# Patient Record
Sex: Female | Born: 1975 | Race: White | Hispanic: No | Smoking: Never smoker
Health system: Southern US, Community
[De-identification: ages and names within clinical notes are randomized; demographics above are authoritative.]

## PROBLEM LIST (undated history)

## (undated) DIAGNOSIS — F329 Major depressive disorder, single episode, unspecified: Secondary | ICD-10-CM

## (undated) DIAGNOSIS — F419 Anxiety disorder, unspecified: Secondary | ICD-10-CM

## (undated) DIAGNOSIS — E039 Hypothyroidism, unspecified: Secondary | ICD-10-CM

## (undated) DIAGNOSIS — F32A Depression, unspecified: Secondary | ICD-10-CM

## (undated) DIAGNOSIS — I10 Essential (primary) hypertension: Secondary | ICD-10-CM

## (undated) DIAGNOSIS — E079 Disorder of thyroid, unspecified: Secondary | ICD-10-CM

## (undated) HISTORY — DX: Essential (primary) hypertension: I10

## (undated) HISTORY — DX: Anxiety disorder, unspecified: F41.9

## (undated) HISTORY — DX: Hypothyroidism, unspecified: E03.9

## (undated) HISTORY — DX: Depression, unspecified: F32.A

## (undated) HISTORY — PX: HX WISDOM TEETH EXTRACTION: SHX21

## (undated) HISTORY — PX: CHOLECYSTECTOMY: SHX55

## (undated) HISTORY — DX: Disorder of thyroid, unspecified: E07.9

---
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
## 2017-08-25 ENCOUNTER — Encounter (HOSPITAL_BASED_OUTPATIENT_CLINIC_OR_DEPARTMENT_OTHER): Payer: Self-pay | Admitting: Obstetrics & Gynecology

## 2017-09-20 ENCOUNTER — Encounter (HOSPITAL_BASED_OUTPATIENT_CLINIC_OR_DEPARTMENT_OTHER): Payer: Self-pay | Admitting: Obstetrics & Gynecology

## 2017-09-20 ENCOUNTER — Ambulatory Visit: Payer: No Typology Code available for payment source | Attending: Obstetrics & Gynecology

## 2017-09-20 ENCOUNTER — Ambulatory Visit (INDEPENDENT_AMBULATORY_CARE_PROVIDER_SITE_OTHER): Payer: No Typology Code available for payment source | Admitting: Obstetrics & Gynecology

## 2017-09-20 VITALS — BP 124/72 | Wt 341.0 lb

## 2017-09-20 DIAGNOSIS — R5383 Other fatigue: Secondary | ICD-10-CM | POA: Insufficient documentation

## 2017-09-20 DIAGNOSIS — Z833 Family history of diabetes mellitus: Secondary | ICD-10-CM | POA: Insufficient documentation

## 2017-09-20 DIAGNOSIS — N911 Secondary amenorrhea: Secondary | ICD-10-CM

## 2017-09-20 DIAGNOSIS — N926 Irregular menstruation, unspecified: Principal | ICD-10-CM | POA: Insufficient documentation

## 2017-09-20 LAB — GLUCOSE FASTING: GLUCOSE FASTING: 96 mg/dL (ref 68–99)

## 2017-09-20 LAB — INSULIN, SERUM: INSULIN: 13.9 m[IU]/mL (ref 2.3–26.0)

## 2017-09-20 LAB — CBC
HCT: 37.2 % (ref 37.0–47.0)
HGB: 12.7 g/dL (ref 12.0–16.0)
MCH: 27.4 pg (ref 27.0–31.0)
MCHC: 34.2 g/dL (ref 33.0–37.0)
MCV: 80.2 fL (ref 80.0–99.0)
PLATELETS: 291 x10ˆ3/uL (ref 130–400)
RBC: 4.64 10*6/uL (ref 4.20–5.40)
RBC: 4.64 x10?6/uL (ref 4.20–5.40)
RDW: 15.5 % — ABNORMAL HIGH (ref 11.5–14.5)
WBC: 6.7 x10ˆ3/uL (ref 4.8–10.8)

## 2017-09-20 LAB — THYROID STIMULATING HORMONE (SENSITIVE TSH): TSH: 4.81 u[IU]/mL — ABNORMAL HIGH (ref 0.465–4.680)

## 2017-09-20 LAB — HCG, PLASMA OR SERUM QUANTITATIVE, PREGNANCY: HCG, QUANTITATIVE: <2.39 m[IU]/mL (ref 0.00–4.83)

## 2017-09-20 MED ORDER — MEDROXYPROGESTERONE 10 MG TABLET
10.0000 mg | ORAL_TABLET | Freq: Every day | ORAL | 0 refills | Status: AC
Start: 2017-09-20 — End: 2017-09-30

## 2017-09-20 NOTE — Progress Notes (Signed)
OB-GYN Progress Note      History of Present Illness    Patient is a 42 year old female here today for evaluation of irregular menses.  LMP was 06-2017.  There was 1 day of vaginal bleeding in 08-2017.  Patient has had negative home pregnancy test.  Not using contraception.  No galactorrhea.  No nausea vomiting or fever.  Positive fatigue.  Positive family history of diabetes.  Contraception.      History    Past Medical History:   Diagnosis Date   . Anxiety    . Depression    . HTN (hypertension)          Past Surgical History:   Procedure Laterality Date   . [place] 2012     No Known Allergies    Outpatient Medications Prior to Visit:  escitalopram oxalate (LEXAPRO) 20 mg Oral Tablet Take 20 mg by mouth Once a day   lisinopril (PRINIVIL) 10 mg Oral Tablet Take 10 mg by mouth Once a day     No facility-administered medications prior to visit.   Social History     Socioeconomic History   . Marital status: Single     Spouse name: Not on file   . Number of children: Not on file   . Years of education: Not on file   . Highest education level: Not on file   Social Needs   . Financial resource strain: Not on file   . Food insecurity - worry: Not on file   . Food insecurity - inability: Not on file   . Transportation needs - medical: Not on file   . Transportation needs - non-medical: Not on file   Occupational History   . Not on file   Tobacco Use   . Smoking status: Never Smoker   . Smokeless tobacco: Never Used   Substance and Sexual Activity   . Alcohol use: Not on file   . Drug use: Not on file   . Sexual activity: Not on file   Other Topics Concern   . Not on file   Social History Narrative   . Not on file     Family Medical History:     Problem Relation (Age of Onset)    Clotting Disorder Maternal Grandmother    Diabetes Paternal Grandmother, Maternal Grandmother    Hypertension Maternal Grandmother, Mother    Uterine Fibroids Mother            OB History     Gravida   2    Para   2    Term     2    Preterm        AB        Living   2       SAB        TAB        Ectopic        Multiple        Live Births   2                 ROS  General: Negative for appetite loss, chills, fever and sweats  Skin: Negative for hives, pruritus and rash  HEENT: Negative for headache and blurred vision  Neck: Negative for neck pain or stiffness  Respiratory: Negative for shortness of breath and cough  Breast: Negative for breast mass and nipple discharge  Cardiovascular: Negative for chest pain or  palpitations  Gastrointestinal: Negative for abdominal pain or consitpation  Genitourinary: Negative for urin ary complaints, vaginal discharge, and UTI     Physical Exam  BP 124/72   Wt (!) 154.7 kg (341 lb)         General  Mental Status: dresses appropriately and alert  General Appearance: healthy, no distress  Orientation: Person, Place and Time    Integumentary  General Characteristics:  Normal color, texture and turgor without significant lesions or rashes    Abdomen  Palpation:  soft, nontender, no palpable masses, no guarding or rebound tenderness    Female Genitourinary  Genitalia: Normal external genitalia, no erythema, no discharge, no vaginal discharge, normal vagina, normal cervix, normal uterus, size and consistency, normal adnexa without tenderness.  No palpable pelvic mass.      Assessment  1. Menstrual irregularity-patient had regular menses until after LMP 06-2017.  Recommended workup as outlined below.  Discussed potential peri menopausal change at age 45 with oligo ovulation.  Also discussed possible component of PCOS with chronic anovulation.  Present weight is 341 lb.  Discussed role of diet exercise and weight reduction with regard to enhancing ovulatory potential.  2. Fatigue  3. Family history of diabetes-grandmother x2.  Patient also states father was "prediabetic.  4. Contraception-none.  Patient declines.    Plan  1. Serum HCG today  2. Fasting insulin  3. Fasting glucose  4. CBC  5. TSH  6.  Prolactin  7. Provera 10 mg 1 p.o. daily times 10 days-anticipate withdrawal bleed thereafter  8. Follow-up in 3-4 weeks for well-woman examination and re-evaluation.  Discussed periodic progestin therapy verses combination OCPs        Mary Mire, MD

## 2017-09-21 LAB — THYROXINE, FREE (FREE T4): THYROXINE (T4), FREE: 1.08 ng/dL (ref 0.78–2.19)

## 2017-09-21 LAB — T3 (TRIIODOTHYRONINE), FREE, SERUM: T3 FREE: 4.2 pg/mL (ref 2.8–5.2)

## 2017-09-22 ENCOUNTER — Telehealth (HOSPITAL_BASED_OUTPATIENT_CLINIC_OR_DEPARTMENT_OTHER): Payer: Self-pay | Admitting: Obstetrics & Gynecology

## 2017-09-22 DIAGNOSIS — E039 Hypothyroidism, unspecified: Secondary | ICD-10-CM

## 2017-09-22 MED ORDER — LEVOTHYROXINE 25 MCG TABLET
12.50 ug | ORAL_TABLET | Freq: Every morning | ORAL | 2 refills | Status: AC
Start: 2017-09-22 — End: ?

## 2017-09-22 NOTE — Telephone Encounter (Signed)
-----   Message from Rogelia Mire, MD sent at 09/21/2017  8:22 PM EDT -----  Please notify patient that TSH is slightly elevated at 4.8.  Free T3 and free T4 are normal.  Given that patient has irregular menses with fatigue an elevated TSH, all could be consistent with subclinical hypothyroidism.  Rx Synthroid 25 mg, 1/2 tab daily.  Dispense 30 tabs, refill x2.  Repeat TSH in 6 weeks.

## 2017-09-22 NOTE — Telephone Encounter (Signed)
Pt notified of TSH, Free T3, and FreeT4 results and Dr. Javier Docker recommendation for Rx synthroid 51mcg 1/2 tab po and to repeat TSH in 6 weeks. Pt verbalized understanding. ERx to be sent to pt's pharmacy.    Hoyle Sauer, RN

## 2017-10-19 ENCOUNTER — Other Ambulatory Visit: Payer: No Typology Code available for payment source | Attending: Obstetrics & Gynecology

## 2017-10-19 ENCOUNTER — Encounter (HOSPITAL_BASED_OUTPATIENT_CLINIC_OR_DEPARTMENT_OTHER): Payer: Self-pay | Admitting: Obstetrics & Gynecology

## 2017-10-19 ENCOUNTER — Other Ambulatory Visit (HOSPITAL_BASED_OUTPATIENT_CLINIC_OR_DEPARTMENT_OTHER): Payer: Self-pay | Admitting: Obstetrics & Gynecology

## 2017-10-19 ENCOUNTER — Ambulatory Visit (INDEPENDENT_AMBULATORY_CARE_PROVIDER_SITE_OTHER): Payer: No Typology Code available for payment source | Admitting: Obstetrics & Gynecology

## 2017-10-19 VITALS — BP 128/84 | Wt 340.0 lb

## 2017-10-19 DIAGNOSIS — Z01419 Encounter for gynecological examination (general) (routine) without abnormal findings: Secondary | ICD-10-CM | POA: Insufficient documentation

## 2017-10-19 DIAGNOSIS — Z124 Encounter for screening for malignant neoplasm of cervix: Secondary | ICD-10-CM

## 2017-10-19 DIAGNOSIS — Z1231 Encounter for screening mammogram for malignant neoplasm of breast: Secondary | ICD-10-CM

## 2017-10-19 DIAGNOSIS — Z1239 Encounter for other screening for malignant neoplasm of breast: Secondary | ICD-10-CM

## 2017-10-19 NOTE — Progress Notes (Signed)
OB-GYN Progress Note      History of Present Illness  Patient is a 42 year old female here today for well-woman examination.  Patient was last seen 09/20/2017 for menstrual irregularity.  History regular menses in 2018 until after LMP 06-2017.  She had 1 day of vaginal bleeding 08-2017.  Labs 09/20/2017 revealed hemoglobin 12.7, prolactin 12.8, fasting glucose 96, fasting insulin 13.9, hCG negative, TSH 4.81 (high).  Free T3 and free T4 normal.  Patient was empirically started on a regimen the Synthroid 25 mcg daily orders given for follow-up TSH in 6 weeks.  The patient was given a 10 day course of Provera which led to a withdraw menstrual bleeding event on 10/02/2017.    We discussed contraception and the patient declines.  Patient indicates she would welcome pregnancy if it happened.  We did discussed advanced maternal age and pregnancy related risk should conception occur, as well as increased risk of fetal chromosomal anomalies and adverse outcomes.  Patient verbalized understanding.    History    Past Medical History:   Diagnosis Date   . Anxiety    . Depression    . HTN (hypertension)    . Hypothyroidism          Past Surgical History:   Procedure Laterality Date   . [place] 2012   . HX WISDOM TEETH EXTRACTION       No Known Allergies    Outpatient Medications Prior to Visit:  levothyroxine (SYNTHROID) 25 mcg Oral Tablet Take 0.5 Tabs (12.5 mcg total) by mouth Every morning     No facility-administered medications prior to visit. .med  Social History     Socioeconomic History   . Marital status: Single     Spouse name: Not on file   . Number of children: Not on file   . Years of education: Not on file   . Highest education level: Not on file   Occupational History   . Not on file   Social Needs   . Financial resource strain: Not on file   . Food insecurity:     Worry: Not on file     Inability: Not on file   . Transportation needs:     Medical: Not on file     Non-medical: Not on file     Tobacco Use   . Smoking status: Never Smoker   . Smokeless tobacco: Never Used   Substance and Sexual Activity   . Alcohol use: Not on file   . Drug use: Not on file   . Sexual activity: Not on file   Lifestyle   . Physical activity:     Days per week: Not on file     Minutes per session: Not on file   . Stress: Not on file   Relationships   . Social connections:     Talks on phone: Not on file     Gets together: Not on file     Attends religious service: Not on file     Active member of club or organization: Not on file     Attends meetings of clubs or organizations: Not on file     Relationship status: Not on file   . Intimate partner violence:     Fear of current or ex partner: Not on file     Emotionally abused: Not on file     Physically abused: Not on file  Forced sexual activity: Not on file   Other Topics Concern   . Not on file   Social History Narrative   . Not on file     Family Medical History:     Problem Relation (Age of Onset)    Clotting Disorder Maternal Grandmother    Diabetes Paternal Grandmother, Maternal Grandmother    Hypertension Maternal Grandmother, Mother    Uterine Fibroids Mother            OB History     Gravida   2    Para   2    Term   2    Preterm        AB        Living   2       SAB        TAB        Ectopic        Multiple        Live Births   2                 ROS  General: Negative for appetite loss, chills, fever and sweats  Skin: Negative for hives, pruritus and rash  HEENT: Negative for headache and blurred vision  Neck: Negative for neck pain or stiffness  Respiratory: Negative for shortness of breath and cough  Breast: Negative for breast mass and nipple discharge  Cardiovascular: Negative for chest pain or palpitations  Gastrointestinal: Negative for abdominal pain or consitpation  Genitourinary: Negative for urin ary complaints, vaginal discharge, and UTI     Physical Exam  BP 128/84   Wt (!) 154.2 kg (340 lb)   LMP 10/02/2017         General  Mental Status: dresses  appropriately and alert  General Appearance: healthy, no distress  Orientation: Person, Place and Time    Integumentary  General Characteristics:  Normal color, texture and turgor without significant lesions or rashes    Breast  Nipple       Right:  Normal        Left:     Normal   Breast       Right:  Soft and Masses: none       Left:   Soft and Masses: none    Axilla  Palpation: No palpable axillary lymph nodes, right or left     Abdomen  Palpation:  soft, nontender, no palpable masses, no guarding or rebound tenderness    Female Genitourinary  Genitalia: Normal external genitalia, no erythema, no discharge, no vaginal discharge, normal vagina, normal cervix, normal uterus, size and consistency, normal adnexa without tenderness.  No palpable pelvic mass.      Assessment  1. Well-woman examination-doing well  2. Contraception-none.  Discussed options available and patient declines.  Patient states she would welcome pregnancy if it occurred.  Counseled regarding risks and elevated rates of fetal chromosomal anomalies and adverse outcomes.  3. Subclinical hypothyroidism with mild elevation a TSH--free T3 and free T4 are normal.  Patient has been started on empiric regimen of Synthroid 20 mcg daily.    4. Menstrual irregularity--menses were regular prior to last normal spontaneous menses 06-2017..  Menses had ceased thereafter other that a single 1 day bleeding event 08-2017.  Workup completed and patient did have a positive withdrawal bleed following a 10 day Provera challenge.  Subclinical hypothyroidism diagnosed empiric therapy initiated.  Discussed option hormonal management which patient declined, indicating her preference would be to  continue with Synthroid supplement and observe for reestablishment spontaneous menstrual cycles.        Plan  1. Pap Done  2. Monthly self-breast checks recommended  3. Continue Synthroid 25 mcg daily  4. Repeat TSH in 4 more weeks  5. Menstrual calendar provided to the patient  6.  Follow-up 4 months recheck and p.r.n.   7. Mammogram ordered    Rogelia Mire, MD

## 2017-10-25 LAB — HISTORICAL CYTOPATHOLOGY-GYN (PAP AND HPV TESTS)

## 2017-12-01 ENCOUNTER — Ambulatory Visit (HOSPITAL_BASED_OUTPATIENT_CLINIC_OR_DEPARTMENT_OTHER): Payer: No Typology Code available for payment source

## 2017-12-02 ENCOUNTER — Ambulatory Visit
Admission: RE | Admit: 2017-12-02 | Discharge: 2017-12-02 | Disposition: A | Discharge status: Home / Self Care | Payer: No Typology Code available for payment source | Source: Ambulatory Visit | Attending: Obstetrics & Gynecology | Admitting: Obstetrics & Gynecology

## 2017-12-02 ENCOUNTER — Encounter (HOSPITAL_BASED_OUTPATIENT_CLINIC_OR_DEPARTMENT_OTHER): Payer: Self-pay

## 2017-12-02 DIAGNOSIS — Z1231 Encounter for screening mammogram for malignant neoplasm of breast: Secondary | ICD-10-CM | POA: Insufficient documentation

## 2017-12-02 DIAGNOSIS — Z1239 Encounter for other screening for malignant neoplasm of breast: Secondary | ICD-10-CM

## 2018-02-17 ENCOUNTER — Encounter (HOSPITAL_BASED_OUTPATIENT_CLINIC_OR_DEPARTMENT_OTHER): Payer: Self-pay | Admitting: Obstetrics & Gynecology

## 2018-02-17 ENCOUNTER — Ambulatory Visit (INDEPENDENT_AMBULATORY_CARE_PROVIDER_SITE_OTHER): Payer: Self-pay | Admitting: Obstetrics & Gynecology

## 2018-02-17 VITALS — BP 142/98 | Wt 349.0 lb

## 2018-02-17 DIAGNOSIS — E038 Other specified hypothyroidism: Secondary | ICD-10-CM

## 2018-02-17 DIAGNOSIS — E039 Hypothyroidism, unspecified: Secondary | ICD-10-CM

## 2018-02-17 DIAGNOSIS — N925 Other specified irregular menstruation: Secondary | ICD-10-CM

## 2018-02-17 DIAGNOSIS — N926 Irregular menstruation, unspecified: Secondary | ICD-10-CM

## 2018-02-17 NOTE — Progress Notes (Signed)
OB-GYN Progress Note      History of Present Illness    This patient is a 42 year old female who is here for re-evaluation regarding menstrual irregularity and hypothyroidism.  Patient her well-woman examination 10/19/2017.  Menstrual irregularity was evaluated and the patient was found to have subclinical hypothyroidism.  She was started on a regimen of Synthroid 25 micro g half tab daily.  Patient was to have a repeat TSH done thereafter, which did not happen.  Patient has been keeping a menstrual calendar.  The patient had 3 menstrual cycle since her last visit, which were spaced at 45 day and 34 day intervals.  Normal characteristic menstrual flow once bleeding started.    History    Past Medical History:   Diagnosis Date   . Anxiety    . Depression    . HTN (hypertension)    . Hypothyroidism          Past Surgical History:   Procedure Laterality Date   . [place] 2012   . HX WISDOM TEETH EXTRACTION       No Known Allergies    Outpatient Medications Prior to Visit:  levothyroxine (SYNTHROID) 25 mcg Oral Tablet Take 0.5 Tabs (12.5 mcg total) by mouth Every morning     No facility-administered medications prior to visit. .med  Social History     Socioeconomic History   . Marital status: Single     Spouse name: Not on file   . Number of children: Not on file   . Years of education: Not on file   . Highest education level: Not on file   Occupational History   . Not on file   Social Needs   . Financial resource strain: Not on file   . Food insecurity:     Worry: Not on file     Inability: Not on file   . Transportation needs:     Medical: Not on file     Non-medical: Not on file   Tobacco Use   . Smoking status: Never Smoker   . Smokeless tobacco: Never Used   Substance and Sexual Activity   . Alcohol use: Not on file   . Drug use: Not on file   . Sexual activity: Not on file   Lifestyle   . Physical activity:     Days per week: Not on file     Minutes per session: Not on file   . Stress: Not  on file   Relationships   . Social connections:     Talks on phone: Not on file     Gets together: Not on file     Attends religious service: Not on file     Active member of club or organization: Not on file     Attends meetings of clubs or organizations: Not on file     Relationship status: Not on file   . Intimate partner violence:     Fear of current or ex partner: Not on file     Emotionally abused: Not on file     Physically abused: Not on file     Forced sexual activity: Not on file   Other Topics Concern   . Not on file   Social History Narrative   . Not on file     Family Medical History:     Problem Relation (Age of Onset)    Clotting Disorder Maternal Grandmother  Diabetes Paternal Grandmother, Maternal Grandmother    Hypertension (High Blood Pressure) Maternal Grandmother, Mother    Uterine Fibroids Mother            OB History     Gravida   2    Para   2    Term   2    Preterm        AB        Living   2       SAB        TAB        Ectopic        Multiple        Live Births   2                 ROS  General: Negative for appetite loss, chills, fever and sweats  Skin: Negative for hives, pruritus and rash  HEENT: Negative for headache and blurred vision  Neck: Negative for neck pain or stiffness  Respiratory: Negative for shortness of breath and cough  Breast: Negative for breast mass and nipple discharge  Cardiovascular: Negative for chest pain or palpitations  Gastrointestinal: Negative for abdominal pain or consitpation  Genitourinary: Negative for urin ary complaints, vaginal discharge, and UTI     Physical Exam  BP (!) 142/98   Wt (!) 158.3 kg (349 lb)   LMP 01/09/2018         General  Mental Status: dresses appropriately and alert  General Appearance: healthy, no distress  Orientation: Person, Place and Time    Integumentary  General Characteristics:  Normal color, texture and turgor without significant lesions or rashes        Assessment  1. Subclinical hypothyroidism with elevation and TSH.   Free T3 and free T4 normal.  Taking Synthroid 25 ucg, half tab daily since last visit 10/19/2017.  Patient failed to follow-up for repeat TSH is ordered.  2. Menstrual irregularity for the last 9 months.  Patient documented 3 menstrual cycles on her menstrual calendar which were separated by 45 day interval and 34 day interval.  Characteristic menstrual flow once onset of bleeding begins.  Patient is satisfied with menstrual activity as is and declines hormone Rx for menstrual regulation  3. Contraception-none.  Discussed options available and patient declines.  Patient states she would welcome pregnancy if it occurred.  Counseled regarding risks and elevated rates of fetal chromosomal anomalies and adverse outcomes.      Plan  1. TSH today  2. Continue Synthroid 25 micro g 1/2 tab daily  3. Continue menstrual calendar  4. Follow-up 4 months recheck and p.r.n.       Rogelia Mire, MD

## 2018-06-21 ENCOUNTER — Ambulatory Visit (HOSPITAL_BASED_OUTPATIENT_CLINIC_OR_DEPARTMENT_OTHER): Payer: Self-pay | Admitting: Obstetrics & Gynecology

## 2018-10-25 ENCOUNTER — Other Ambulatory Visit (HOSPITAL_BASED_OUTPATIENT_CLINIC_OR_DEPARTMENT_OTHER): Payer: Self-pay | Admitting: Obstetrics & Gynecology

## 2019-01-15 ENCOUNTER — Other Ambulatory Visit: Payer: Self-pay

## 2019-01-15 ENCOUNTER — Ambulatory Visit: Payer: No Typology Code available for payment source | Attending: Obstetrics & Gynecology

## 2019-01-15 ENCOUNTER — Ambulatory Visit (HOSPITAL_BASED_OUTPATIENT_CLINIC_OR_DEPARTMENT_OTHER): Payer: Self-pay | Admitting: Obstetrics & Gynecology

## 2019-01-15 DIAGNOSIS — Z3490 Encounter for supervision of normal pregnancy, unspecified, unspecified trimester: Secondary | ICD-10-CM

## 2019-01-15 NOTE — Telephone Encounter (Signed)
Pt called office stating lmp 08/13/18; h/o irregular menses. Pt stated had faint, positive upt; requesting blood pregnancy test. PO Dr. Ander Purpura ordered quantitative bhcg. Explained Dr. Javier Docker recommendations to pt. Pt verbalized understanding. Order entered.    Hoyle Sauer, RN

## 2019-01-16 ENCOUNTER — Ambulatory Visit (HOSPITAL_BASED_OUTPATIENT_CLINIC_OR_DEPARTMENT_OTHER): Payer: Self-pay | Admitting: Obstetrics & Gynecology

## 2019-01-16 NOTE — Telephone Encounter (Signed)
Pt notified of negative HCG from 01/15/19. Pt verbalized understanding stating having pelvic pressure and irregular menses. Pt scheduled for MD appt on 01/25/19.    Hoyle Sauer, RN

## 2019-01-18 ENCOUNTER — Encounter (HOSPITAL_COMMUNITY): Payer: Self-pay

## 2019-01-18 ENCOUNTER — Other Ambulatory Visit: Payer: Self-pay

## 2019-01-18 ENCOUNTER — Emergency Department (HOSPITAL_COMMUNITY): Payer: No Typology Code available for payment source

## 2019-01-18 ENCOUNTER — Emergency Department
Admission: EM | Admit: 2019-01-18 | Discharge: 2019-01-18 | Disposition: A | Discharge status: Home / Self Care | Payer: No Typology Code available for payment source | Attending: Emergency Medicine | Admitting: Emergency Medicine

## 2019-01-18 DIAGNOSIS — Z9049 Acquired absence of other specified parts of digestive tract: Secondary | ICD-10-CM | POA: Insufficient documentation

## 2019-01-18 DIAGNOSIS — E039 Hypothyroidism, unspecified: Secondary | ICD-10-CM | POA: Insufficient documentation

## 2019-01-18 DIAGNOSIS — Z79899 Other long term (current) drug therapy: Secondary | ICD-10-CM | POA: Insufficient documentation

## 2019-01-18 DIAGNOSIS — R109 Unspecified abdominal pain: Secondary | ICD-10-CM

## 2019-01-18 DIAGNOSIS — R1010 Upper abdominal pain, unspecified: Secondary | ICD-10-CM | POA: Insufficient documentation

## 2019-01-18 LAB — URINALYSIS, MICROSCOPIC

## 2019-01-18 LAB — CBC WITH DIFF
BASOPHIL #: 0 10*3/uL (ref 0.00–1.00)
BASOPHIL %: 0 % (ref 0–1)
EOSINOPHIL #: 0.1 10*3/uL (ref 0.00–0.40)
EOSINOPHIL %: 1 % (ref 1–5)
HCT: 39.7 % (ref 37.0–47.0)
HGB: 13.2 g/dL (ref 12.0–16.0)
LYMPHOCYTE #: 2.3 10*3/uL (ref 1.00–4.00)
LYMPHOCYTE %: 20 % — ABNORMAL LOW (ref 25–40)
MCH: 27.6 pg (ref 27.0–31.0)
MCHC: 33.2 g/dL (ref 33.0–37.0)
MCV: 83.1 fL (ref 80.0–99.0)
MONOCYTE #: 0.9 10*3/uL — ABNORMAL HIGH (ref 0.10–0.80)
MONOCYTE %: 8 % (ref 4–10)
NEUTROPHIL #: 8.3 10*3/uL — ABNORMAL HIGH (ref 1.80–7.00)
NEUTROPHIL %: 71 % (ref 50–73)
PLATELETS: 329 10*3/uL (ref 130–400)
RBC: 4.78 10*6/uL (ref 4.20–5.40)
RDW: 14.2 % (ref 11.5–14.5)
WBC: 11.7 10*3/uL — ABNORMAL HIGH (ref 4.8–10.8)

## 2019-01-18 LAB — COMPREHENSIVE METABOLIC PANEL, NON-FASTING
ALBUMIN: 4.3 g/dL (ref 3.6–4.8)
ALKALINE PHOSPHATASE: 85 U/L (ref 38–126)
ALT (SGPT): 39 U/L (ref 3–45)
ANION GAP: 3 mmol/L
AST (SGOT): 23 U/L (ref 7–56)
BILIRUBIN TOTAL: 0.4 mg/dL (ref 0.2–1.3)
BUN/CREA RATIO: 19
BUN: 13 mg/dL (ref 7–18)
CALCIUM: 9.4 mg/dL (ref 8.5–10.3)
CHLORIDE: 104 mmol/L (ref 101–111)
CO2 TOTAL: 28 mmol/L (ref 22–31)
CREATININE: 0.7 mg/dL (ref 0.50–1.20)
ESTIMATED GFR: 105 mL/min/{1.73_m2} (ref >=60)
GLUCOSE: 104 mg/dL — ABNORMAL HIGH (ref 68–99)
POTASSIUM: 4.1 mmol/L (ref 3.6–5.0)
PROTEIN TOTAL: 7.5 g/dL (ref 6.2–8.0)
SODIUM: 135 mmol/L — ABNORMAL LOW (ref 137–145)

## 2019-01-18 LAB — URINALYSIS, MACROSCOPIC
BILIRUBIN: NOT DETECTED mg/dL
BLOOD: NOT DETECTED mg/dL
GLUCOSE: NOT DETECTED mg/dL
KETONES: NOT DETECTED mg/dL
NITRITE: NOT DETECTED
PH: 5 — ABNORMAL LOW (ref 5.0–8.0)
PROTEIN: NOT DETECTED mg/dL
SPECIFIC GRAVITY: 1.023 (ref 1.005–1.030)
UROBILINOGEN: NOT DETECTED mg/dL

## 2019-01-18 LAB — LIPASE: LIPASE: 61 U/L (ref 23–203)

## 2019-01-18 LAB — HCG, PLASMA OR SERUM QUANTITATIVE, PREGNANCY: HCG, QUANTITATIVE: <2.39 m[IU]/mL (ref 0.00–4.83)

## 2019-01-18 MED ORDER — SODIUM CHLORIDE 0.9 % (FLUSH) INJECTION SYRINGE
3.0000 mL | INJECTION | INTRAMUSCULAR | Status: DC | PRN
Start: 2019-01-18 — End: 2019-01-19

## 2019-01-18 MED ORDER — IOPAMIDOL 370 MG IODINE/ML (76 %) INTRAVENOUS SOLUTION
100.00 mL | INTRAVENOUS | Status: AC
Start: 2019-01-18 — End: 2019-01-18
  Administered 2019-01-18: 100 mL via INTRAVENOUS

## 2019-01-18 MED ORDER — SODIUM CHLORIDE 0.9 % (FLUSH) INJECTION SYRINGE
3.0000 mL | INJECTION | Freq: Three times a day (TID) | INTRAMUSCULAR | Status: DC
Start: 2019-01-18 — End: 2019-01-19

## 2019-01-18 MED ORDER — SODIUM CHLORIDE 0.9 % IV BOLUS
1000.0000 mL | INJECTION | Status: AC
Start: 2019-01-18 — End: 2019-01-18
  Administered 2019-01-18: 1000 mL via INTRAVENOUS
  Administered 2019-01-18: 0 mL via INTRAVENOUS

## 2019-01-18 NOTE — ED Triage Notes (Signed)
See triage comment

## 2019-01-18 NOTE — ED Resident Handoff Note (Signed)
Emergency Department  Provider Note  HPI - 01/18/2019    Name: Mary Gomez  Age and Gender: 43 y.o. female  Attending: Dr. Richarda Blade  APP: Vic Blackbird PA-C      HPI:  Mary Gomez is a 43 y.o. female  who presents to the ED today for abdominal pain.  Patient reports onset of and lower abdominal pressure about 2 weeks ago.  Pain was occurring intermittently and felt like "movement".  She reports in the last several days she has developed a more intense but still intermittent pain in the upper abdomen.  She reports that she still has lower abdominal discomfort but the upper abdomen is more intense.  Today's pain was the worst it had been.  She reports today's episode has improved since onset is currently severity 3/10.  She describes it as a pressure.  She has tried Gas-X, Pepcid without any improvement of symptoms.  She reports that initially with the lower abdominal pressure she thought that she could be pregnant so she contacted her OBGYN who completed a blood pregnancy test which was negative.  She denies any dysuria, hematuria.  Reports normal bowel movements.  No fevers.  No chest pain or shortness of breath.  Reports her last menstrual period was in February.  Abdominal surgical history remarkable for prior cholecystectomy.        History provided by: patient      Review of Systems:    Constitutional: No fever, chills  Skin: No rashes or lesions  HENT: No sore throat, ear pain, or difficulty swallowing  Eyes: No vision changes, redness, discharge  Cardio: No chest pain, palpitations   Respiratory: No cough, wheezing or SOB  GI: Abdominal pain, pressure. No nausea or vomiting. No diarrhea or constipation.   GU:  No dysuria, hematuria, polyuria  MSK: No joint pain.  No neck or back pain  Neuro: No numbness, tingling, or weakness.  No headache  All other systems reviewed and are negative, unless commented on in the HPI.       Below information reviewed with patient:   Current Outpatient  Medications   Medication Sig   . levothyroxine (SYNTHROID) 25 mcg Oral Tablet Take 0.5 Tabs (12.5 mcg total) by mouth Every morning       No Known Allergies       Past Medical History:  Past Medical History:   Diagnosis Date   . Anxiety    . Depression    . HTN (hypertension)    . Hypothyroidism        Past Surgical History:  Past Surgical History:   Procedure Laterality Date   . Hx cesarean section     . Hx wisdom teeth extraction         Social History:  Social History     Tobacco Use   . Smoking status: Never Smoker   . Smokeless tobacco: Never Used   Substance Use Topics   . Alcohol use: Not on file   . Drug use: Not on file     Social History     Substance and Sexual Activity   Drug Use Not on file       Family History:  Family History   Problem Relation Age of Onset   . Diabetes Paternal Grandmother    . Clotting Disorder Maternal Grandmother    . Diabetes Maternal Grandmother    . Hypertension (High Blood Pressure) Maternal Grandmother    . Hypertension (High Blood Pressure)  Mother    . Uterine Fibroids Mother    . Breast Cancer Neg Hx    . Colon Cancer Neg Hx    . Uterine Cancer Neg Hx    . Ovarian Cancer Neg Hx          Old records reviewed.      Objective:  Nursing notes reviewed    Filed Vitals:    01/18/19 1905   BP: 134/88   Pulse: 83   Resp: 18   Temp: 36.9 C (98.4 F)   SpO2: 98%       Physical Exam  Nursing note and vitals reviewed.  Vital signs reviewed as above.     Constitutional:  Obese.  Pt is well-developed and well-nourished.   Head: Normocephalic and atraumatic.   Eyes: Conjunctivae are normal. Pupils are equal, round. EOM are intact  Neck: Soft, supple, full range of motion.  Cardiovascular: RRR.  No Murmurs/rubs/gallops. Distal pulses present and equal bilaterally.  Pulmonary/Chest: Normal BS BL with no distress. No audible wheezes or crackles are noted.  Abdominal:  Mild epigastric tenderness.  Soft, nondistended.  No rebound, guarding, or masses.  Musculoskeletal: Normal range of motion.  No deformities.  Exhibits no edema.   Neurological: CNs 2-12 grossly intact.  No focal deficits noted.  Skin: Warm and dry. No rash or lesions  Psychiatric: Patient has a normal mood and affect.     Work-up:  Orders Placed This Encounter   . CT ABDOMEN PELVIS W IV CONTRAST   . CBC/DIFF   . COMPREHENSIVE METABOLIC PANEL, NON-FASTING   . LIPASE   . URINALYSIS, MACROSCOPIC   . HCG, PLASMA OR SERUM QUANTITATIVE, PREGNANCY   . CBC WITH DIFF   . URINALYSIS, MICROSCOPIC   . INSERT & MAINTAIN PERIPHERAL IV ACCESS   . NS flush syringe   . NS flush syringe   . NS bolus infusion 1,000 mL   . iopamidol (ISOVUE-370) 76% infusion        Labs:  Results for orders placed or performed during the hospital encounter of 01/18/19 (from the past 24 hour(s))   CBC/DIFF    Narrative    The following orders were created for panel order CBC/DIFF.  Procedure                               Abnormality         Status                     ---------                               -----------         ------                     CBC WITH DIFF[[id number]]                Abnormal            Final result                 Please view results for these tests on the individual orders.   COMPREHENSIVE METABOLIC PANEL, NON-FASTING   Result Value Ref Range    SODIUM 135 (L) 137 - 145 mmol/L    POTASSIUM 4.1 3.6 - 5.0 mmol/L    CHLORIDE 104 101 - 111  mmol/L    CO2 TOTAL 28 22 - 31 mmol/L    ANION GAP 3 mmol/L    BUN 13 7 - 18 mg/dL    CREATININE 0.70 0.50 - 1.20 mg/dL    BUN/CREA RATIO 19     ESTIMATED GFR 105 >=60 mL/min/1.38m2    ALBUMIN 4.3 3.6 - 4.8 g/dL    CALCIUM 9.4 8.5 - 10.3 mg/dL    GLUCOSE 104 (H) 68 - 99 mg/dL    ALKALINE PHOSPHATASE 85 38 - 126 U/L    ALT (SGPT) 39 3 - 45 U/L    AST (SGOT) 23 7 - 56 U/L    BILIRUBIN TOTAL 0.4 0.2 - 1.3 mg/dL    PROTEIN TOTAL 7.5 6.2 - 8.0 g/dL    Narrative    Estimated Glomerular Filtration Rate (eGFR) calculated using the CKD-EPI (2009) equation, intended for patients 161years of age and older. If race and/or gender  is not documented or "unknown," there will be no eGFR calculation.   LIPASE   Result Value Ref Range    LIPASE 61 23 - 203 U/L   URINALYSIS, MACROSCOPIC   Result Value Ref Range    COLOR Yellow Colorless, Straw, Yellow    APPEARANCE Hazy (A) Clear    SPECIFIC GRAVITY 1.023 >1.005-<1.030    PH 5.0 (L) >5.0-<8.0    PROTEIN Not Detected Not Detected mg/dL    GLUCOSE Not Detected Not Detected mg/dL    KETONES Not Detected Not Detected mg/dL    UROBILINOGEN Not Detected Not Detected mg/dL    BILIRUBIN Not Detected Not Detected mg/dL    BLOOD Not Detected Not Detected mg/dL    NITRITE Not Detected Not Detected    LEUKOCYTES Trace (A) Not Detected WBCs/uL   HCG, PLASMA OR SERUM QUANTITATIVE, PREGNANCY   Result Value Ref Range    HCG, QUANTITATIVE <2.39 0.00 - 4.83 mIU/mL    Narrative    Gestation Week Range (mIU/mL)  ______________   ______________            4          4700 - 132800          5        3660 - 1270623          762831-- 517616          073710-- 626948          546270-- 350093         [id number]- 1371696          7893810- 1175102          585277- 1824235          361443- 1154008         [phone]         [phone]  _______________________________  -hCG Limitations:    _______________________________    If a total hCG result is inconsistent with the clinical picture and is persistently elevated, the total hCG result should be confirmed with a urine hCG test or by repeating the serum test on a different assay system. Falsely elevated total serum hCG results in serum might be due to the presence of heterophilic antibodies, such as anti-mouse antibodies (HAMA), to nonspecific protein binding, and to hCG-like substances. The concentration of hCG in maternal serum rises rapidly in early pregnancy. hCG levels less than 25 mIU/mL do  not exclude pregnancy. A further sample should be tested  after 48 hours if pregnancy is suspected. The Ortho Chemiluminescence Assay was used to determine this result. The use of this assay to monitor or to diagnose patients with cancer has not been approved by the FDA or the manufacturer of the assay.     CBC WITH DIFF   Result Value Ref Range    WBC 11.7 (H) 4.8 - 10.8 x10^3/uL    RBC 4.78 4.20 - 5.40 x10^6/uL    HGB 13.2 12.0 - 16.0 g/dL    HCT 39.7 37.0 - 47.0 %    MCV 83.1 80.0 - 99.0 fL    MCH 27.6 27.0 - 31.0 pg    MCHC 33.2 33.0 - 37.0 g/dL    RDW 14.2 11.5 - 14.5 %    PLATELETS 329 130 - 400 x10^3/uL    NEUTROPHIL % 71 50 - 73 %    LYMPHOCYTE % 20 (L) 25 - 40 %    MONOCYTE % 8 4 - 10 %    EOSINOPHIL % 1 1 - 5 %    BASOPHIL % 0 0 - 1 %    NEUTROPHIL # 8.30 (H) 1.80 - 7.00 x10^3/uL    LYMPHOCYTE # 2.30 1.00 - 4.00 x10^3/uL    MONOCYTE # 0.90 (H) 0.10 - 0.80 x10^3/uL    EOSINOPHIL # 0.10 0.00 - 0.40 x10^3/uL    BASOPHIL # 0.00 0.00 - 1.00 x10^3/uL   URINALYSIS, MICROSCOPIC   Result Value Ref Range    WBCS 6-10 (A) 0-5, None /hpf    RBCS 0-5 None, 0-5 /hpf    BACTERIA Trace (A) None /hpf    SQUAMOUS EPITHELIAL Rare (A) None /hpf    MUCOUS Mod (A) None /hpf       Imaging:     Results for orders placed or performed during the hospital encounter of 01/18/19 (from the past 72 hour(s))   CT ABDOMEN PELVIS W IV CONTRAST     Status: None    Narrative    Female, 43 years old.    CT ABDOMEN PELVIS W IV CONTRAST performed on 01/18/2019 10:52 PM.    REASON FOR EXAM:  abd pain    CT Dose:  1560 DLP (mGy*cm)  This CT scanner is equipped with dose reducing technology. The mAs is  automatically adjusted to patient's body size in order to deliver the  lowest dose possible.    CONTRAST: 100 ml's of Isovue 370    FINDINGS: Axial postcontrast CT imaging of the abdomen and pelvis was  performed along with coronal reformats. The included lung bases show no  acute process. The heart is at the upper limits of normal in  size.    The liver, pancreas, spleen, adrenal glands and kidneys show no acute  process. The gallbladder is surgically absent. The small and large bowel  show no obstructive change. No focal bowel wall thickening or surrounding  inflammation is detected. The bladder, uterus and adnexal regions are  unremarkable. No free fluid or air is seen.      Impression    1. No acute intra-abdominal or pelvic process is seen to account for  patient's pain and symptomatology. If symptoms persist, short interval  follow-up may be performed as clinically warranted.  2. Borderline heart size.        Radiologist location ID: [id number]         Abnormal Lab results:  Labs Reviewed   COMPREHENSIVE METABOLIC PANEL, NON-FASTING - Abnormal; Notable for the following components:  Result Value    SODIUM 135 (*)     GLUCOSE 104 (*)     All other components within normal limits    Narrative:     Estimated Glomerular Filtration Rate (eGFR) calculated using the CKD-EPI (2009) equation, intended for patients 14 years of age and older. If race and/or gender is not documented or "unknown," there will be no eGFR calculation.   URINALYSIS, MACROSCOPIC - Abnormal; Notable for the following components:    APPEARANCE Hazy (*)     PH 5.0 (*)     LEUKOCYTES Trace (*)     All other components within normal limits   CBC WITH DIFF - Abnormal; Notable for the following components:    WBC 11.7 (*)     LYMPHOCYTE % 20 (*)     NEUTROPHIL # 8.30 (*)     MONOCYTE # 0.90 (*)     All other components within normal limits   URINALYSIS, MICROSCOPIC - Abnormal; Notable for the following components:    WBCS 6-10 (*)     BACTERIA Trace (*)     SQUAMOUS EPITHELIAL Rare (*)     MUCOUS Mod (*)     All other components within normal limits   LIPASE - Normal   HCG, PLASMA OR SERUM QUANTITATIVE, PREGNANCY - Normal    Narrative:     Gestation Week Range (mIU/mL)  ______________   ______________            4          4700 - 132800          5        3660 -  134000          323557 - 135000          322025 - 427062          376283 - 151761          607371 - 062694          8546270 - 350093          818299 - 371696          789381 - 017510         [phone] - [phone]         [phone] - [phone]   _______________________________  -hCG Limitations:    _______________________________    If a total hCG result is inconsistent with the clinical picture and is persistently elevated, the total hCG result should be confirmed with a urine hCG test or by repeating the serum test on a different assay system. Falsely elevated total serum hCG results in serum might be due to the presence of heterophilic antibodies, such as anti-mouse antibodies (HAMA), to nonspecific protein binding, and to hCG-like substances. The concentration of hCG in maternal serum rises rapidly in early pregnancy. hCG levels less than 25 mIU/mL do not exclude pregnancy. A further sample should be tested after 48 hours if pregnancy is suspected. The Ortho Chemiluminescence Assay was used to determine this result. The use of this assay to monitor or to diagnose patients with cancer has not been approved by the FDA or the manufacturer of the assay.     CBC/DIFF    Narrative:     The following orders were created for panel order CBC/DIFF.  Procedure                               Abnormality  Status                     ---------                               -----------         ------                     CBC WITH DIFF[[id number]]                Abnormal            Final result                 Please view results for these tests on the individual orders.       Plan: Appropriate labs and imaging ordered. Medical Records reviewed.     ED Course as of Jan 19 12   Thu Jan 18, 2019   2221 Patient reassessed, resting comfortably in cot.  Updated on results of  this point    [HN]   2320 Patient reassessed and results discussed.  Advised to follow up with her PCP outpatient.  Return precautions    [HN]      ED Course User Index  [HN] Vic Blackbird, PA-C       MDM:   During the patient's stay in the emergency department, the above listed imaging and/or labs were performed to assist with medical decision making and were reviewed by myself when available for review.     Pt remained stable throughout the emergency department course.          Impression:   Encounter Diagnosis   Name Primary?   . Abdominal pain Yes     Disposition:  Discharged         Medication instructions were discussed with the patient   It was advised that the patient return to the ED with any new, concerning or worsening symptoms and follow up as directed.    The patient verbalized understanding of all instructions and had no further questions or concerns.     Follow up:   Annitta Jersey, DO  [place] [address]  [phone]          [facility]  [address] [place]  [place] [address]  [phone]    If symptoms worsen    I personally performed the services described in this documentation, as scribed in my presence, and it is both accurate and complete.   Vic Blackbird, PA-C  The co-signing faculty was physically present in the emergency department and available for consultation and did not participate in the care of this patient.    Vic Blackbird, PA-C  01/19/2019, 00:13

## 2019-01-25 ENCOUNTER — Other Ambulatory Visit: Payer: Self-pay

## 2019-01-25 ENCOUNTER — Encounter (HOSPITAL_BASED_OUTPATIENT_CLINIC_OR_DEPARTMENT_OTHER): Payer: Self-pay | Admitting: Obstetrics & Gynecology

## 2019-01-25 ENCOUNTER — Ambulatory Visit (INDEPENDENT_AMBULATORY_CARE_PROVIDER_SITE_OTHER): Payer: Medicaid Other | Admitting: Obstetrics & Gynecology

## 2019-01-25 ENCOUNTER — Ambulatory Visit: Payer: Medicaid Other | Attending: Obstetrics & Gynecology

## 2019-01-25 VITALS — BP 124/85 | Wt 343.0 lb

## 2019-01-25 DIAGNOSIS — N912 Amenorrhea, unspecified: Secondary | ICD-10-CM | POA: Insufficient documentation

## 2019-01-25 DIAGNOSIS — R350 Frequency of micturition: Secondary | ICD-10-CM

## 2019-01-25 DIAGNOSIS — Z3200 Encounter for pregnancy test, result unknown: Secondary | ICD-10-CM | POA: Insufficient documentation

## 2019-01-25 DIAGNOSIS — R102 Pelvic and perineal pain: Secondary | ICD-10-CM

## 2019-01-25 DIAGNOSIS — Z6841 Body Mass Index (BMI) 40.0 and over, adult: Secondary | ICD-10-CM

## 2019-01-25 DIAGNOSIS — Z202 Contact with and (suspected) exposure to infections with a predominantly sexual mode of transmission: Secondary | ICD-10-CM

## 2019-01-25 DIAGNOSIS — N926 Irregular menstruation, unspecified: Secondary | ICD-10-CM

## 2019-01-25 LAB — URINALYSIS, MICROSCOPIC

## 2019-01-25 LAB — URINALYSIS, MACROSCOPIC
BILIRUBIN: NOT DETECTED mg/dL
BLOOD: NOT DETECTED mg/dL
GLUCOSE: NOT DETECTED mg/dL
KETONES: NOT DETECTED mg/dL
NITRITE: NOT DETECTED
PH: 5 — ABNORMAL LOW (ref 5.0–8.0)
PROTEIN: NOT DETECTED mg/dL
SPECIFIC GRAVITY: 1.025 (ref 1.005–1.030)
UROBILINOGEN: 2 mg/dL — AB

## 2019-01-25 LAB — PROLACTIN: PROLACTIN: 57.8 ng/mL — ABNORMAL HIGH (ref 3.0–18.6)

## 2019-01-25 LAB — FSH: FSH: 1.6 m[IU]/mL

## 2019-01-25 LAB — HCG, URINE QUALITATIVE, PREGNANCY: HCG URINE QUALITATIVE: NOT DETECTED

## 2019-01-25 MED ORDER — MEDROXYPROGESTERONE 10 MG TABLET
10.0000 mg | ORAL_TABLET | Freq: Every day | ORAL | 0 refills | Status: AC
Start: 2019-01-25 — End: 2019-02-04

## 2019-01-25 NOTE — Progress Notes (Signed)
OB-GYN Progress Note      History of Present Illness  This patient is a 43 year old female who presents for evaluation of abdominal/pelvic pain and irregular menses.  LMP 08/04/2018.  Patient conveys a 3 week history abdominal and pelvic discomfort described as pressure, and states that she has a sensation something "moving" in the abdomen.  This led to an ED evaluation at [facility] 01/15/2019.  At that time HCG is negative.  WBC 11.7.  Urinalysis negative.  CT negative for abdominal or pelvic pathology.  Today, 01/24/2019 patient has the same general abdominal/pelvic discomfort/symptoms.  Patient denies nausea, vomiting, or fever.  Positive urinary frequency.  Bowel movements usually twice weekly, although daily bowel activity recently.  Noted patient does not use contraception indicating that she would welcome pregnancy--previously counseled regarding risks with advanced maternal age.  There is no galactorrhea.    Patient is examined and counseled, and the overall impression is that patient's symptomatology is likely related GI etiology.  No gynecologic pathology identified on examination.  Patient also counseled with regard to amenorrhea.  Recommended workup with UA C&S, FSH, prolactin, urine HCG, urine GC chlamydia.  Noted TSH recently normal with PCP per patient.  Recommended 10 day progestin challenge with 10 mg Provera to induce withdraw bleeding event.  Recommended patient follow-up gyn as scheduled 03-2019 for well-woman examination and re-evaluation.  Patient states she has an appointment with PCP, which I did recommend keeping, and discussing GI referral with PCP at that time.    History    Past Medical History:   Diagnosis Date   . Anxiety    . Depression    . HTN (hypertension)    . Hypothyroidism          Past Surgical History:   Procedure Laterality Date   . [place] 2012   . HX WISDOM TEETH EXTRACTION       No Known Allergies  cholecalciferol, vitamin D3, (VITAMIN D3 ORAL), Take by  mouth  escitalopram oxalate (LEXAPRO) 20 mg Oral Tablet, Take 20 mg by mouth Once a day  levothyroxine (SYNTHROID) 25 mcg Oral Tablet, Take 0.5 Tabs (12.5 mcg total) by mouth Every morning  lisinopriL (PRINIVIL) 5 mg Oral Tablet, Take 5 mg by mouth Once a day    No facility-administered medications prior to visit.   .med  Social History     Socioeconomic History   . Marital status: Single     Spouse name: Not on file   . Number of children: Not on file   . Years of education: Not on file   . Highest education level: Not on file   Occupational History   . Not on file   Social Needs   . Financial resource strain: Not on file   . Food insecurity     Worry: Not on file     Inability: Not on file   . Transportation needs     Medical: Not on file     Non-medical: Not on file   Tobacco Use   . Smoking status: Never Smoker   . Smokeless tobacco: Never Used   Substance and Sexual Activity   . Alcohol use: Not on file   . Drug use: Not on file   . Sexual activity: Not on file   Lifestyle   . Physical activity     Days per week: Not on file     Minutes per session: Not on file   .  Stress: Not on file   Relationships   . Social Product manager on phone: Not on file     Gets together: Not on file     Attends religious service: Not on file     Active member of club or organization: Not on file     Attends meetings of clubs or organizations: Not on file     Relationship status: Not on file   . Intimate partner violence     Fear of current or ex partner: Not on file     Emotionally abused: Not on file     Physically abused: Not on file     Forced sexual activity: Not on file   Other Topics Concern   . Not on file   Social History Narrative   . Not on file     Family Medical History:     Problem Relation (Age of Onset)    Clotting Disorder Maternal Grandmother    Diabetes Paternal Grandmother, Maternal Grandmother    Hypertension (High Blood Pressure) Maternal Grandmother, Mother    Uterine Fibroids Mother            OB  History     Gravida   2    Para   2    Term   2    Preterm        AB        Living   2       SAB        TAB        Ectopic        Multiple        Live Births   2                 ROS  General: Negative for appetite loss, chills, fever and sweats  Skin: Negative for hives, pruritus and rash  HEENT: Negative for headache and blurred vision  Neck: Negative for neck pain or stiffness  Respiratory: Negative for shortness of breath and cough  Breast: Negative for breast mass and nipple discharge  Cardiovascular: Negative for chest pain or palpitations  Gastrointestinal: Negative for abdominal pain or consitpation  Genitourinary: Negative for urin ary complaints, vaginal discharge, and UTI     Physical Exam  BP 124/85   Wt (!) 156 kg (343 lb)   LMP 08/04/2018   BMI 49.22 kg/m         General  Mental Status: dresses appropriately and alert  General Appearance: healthy, no distress  Orientation: Person, Place and Time    Integumentary  General Characteristics:  Normal color, texture and turgor without significant lesions or rashes    Abdomen  Palpation:  soft, nontender, no palpable masses, no guarding or rebound tenderness.  Patient does feel sense of abdominal pressure with palpation.    Female Genitourinary  Speculum examination reveals grossly normal vulva, vagina, cervix.  No lesions or discharge.    Bimanual pelvic examination negative for mass or tenderness      Assessment  1. Pelvic pain and abdominal pain---2-3 week duration, described as pressure sensation and movement in abdomen and pelvis.  Workup in ED noted 01/15/2019 with negative CT abdomen/pelvis, and HCG negative.  Discussed suspected underlying GI etiology.  2. Amenorrhea--LMP 08/04/2018.  Patient states normal TSH with PCP recently.  Recommended repeating urine pregnancy test with additional workup as outlined below and progestin challenge  3. Contraception-none.  Patient has a history of infertility  and states she would welcome pregnancy at this  point.  Patient had been counseled regarding risks with advanced maternal age.    4. Urinary frequency      Plan  1. Noted TSH normal with PCP recently per patient  2. [place]  3. Prolactin  4. HCG  5. UA C&S  6. GC chlamydia from urine  7. Provera 10 mg times 10 days  8. Follow-up 03-2019 with gyn for well-woman screening examination and recheck  9. Patient advised to keep scheduled follow-up with PCP, and discuss GI referral for further evaluation at that time       Mary Mire, MD

## 2019-01-26 ENCOUNTER — Telehealth (HOSPITAL_BASED_OUTPATIENT_CLINIC_OR_DEPARTMENT_OTHER): Payer: Self-pay | Admitting: Obstetrics & Gynecology

## 2019-01-26 ENCOUNTER — Inpatient Hospital Stay (INDEPENDENT_AMBULATORY_CARE_PROVIDER_SITE_OTHER)
Admission: RE | Admit: 2019-01-26 | Discharge: 2019-01-26 | Disposition: A | Discharge status: Home / Self Care | Payer: Medicaid Other | Source: Ambulatory Visit

## 2019-01-26 DIAGNOSIS — N83201 Unspecified ovarian cyst, right side: Secondary | ICD-10-CM

## 2019-01-26 DIAGNOSIS — D251 Intramural leiomyoma of uterus: Secondary | ICD-10-CM

## 2019-01-26 DIAGNOSIS — R102 Pelvic and perineal pain: Secondary | ICD-10-CM

## 2019-01-26 NOTE — Nursing Note (Signed)
Patient notified of [facility] results showing she is not menopausal and pregnancy test was negative.  However, her Prolactin level was significantly elevated at 57.  Therefore, Dr. Ander Purpura recommended she come in for an appt to discuss treatment and additional recommendations.  Patient verbalized her understanding and appt was made for 02/02/19.    Mary Gomez, [place]

## 2019-01-26 NOTE — Telephone Encounter (Signed)
-----   Message from Mary Mire, MD sent at 01/25/2019  8:26 PM EDT -----  Please notify patient that [facility] is 1.60, and patient is not menopausal.  Pregnancy test is also negative.  Prolactin level is significantly elevated at 57. I need to see the patient back in the office next week to discuss treatment an additional recommendations.

## 2019-01-27 LAB — CHLAMYDIA AND NEISSERIA BY NAA
CHLAMYDIA TRACHOMATIS AMPLIFIED RNA: NEGATIVE
NEISSERIA GONORRHOEAE AMPLIFIED RNA: NEGATIVE

## 2019-01-27 LAB — URINE CULTURE: URINE CULTURE: >100000 — AB

## 2019-01-29 ENCOUNTER — Telehealth (HOSPITAL_BASED_OUTPATIENT_CLINIC_OR_DEPARTMENT_OTHER): Payer: Self-pay | Admitting: Obstetrics & Gynecology

## 2019-01-29 NOTE — Telephone Encounter (Signed)
Pt notified of urine culture results and Dr. Javier Docker order for Rx amoxicillin 500 mg po tid x7 days #21 with no refills. Pt verbalized understanding and stated PCP office notified her of urine culture results and Rx already sent to pt's pharmacy.    Hoyle Sauer, RN

## 2019-01-29 NOTE — Telephone Encounter (Signed)
-----   Message from Rogelia Mire, MD sent at 01/27/2019  2:17 PM EDT -----  Please notify patient that urine culture positive for UTI with Proteus mirabilis.  Rx amoxicillin 500 mg, 1 p.o. T.i.d. X7 days.  Dispense 21 tab.  Refill times 0. Recommend good hydration and frequent voids as well.

## 2019-01-31 ENCOUNTER — Ambulatory Visit (HOSPITAL_BASED_OUTPATIENT_CLINIC_OR_DEPARTMENT_OTHER): Payer: Self-pay | Admitting: Obstetrics & Gynecology

## 2019-01-31 NOTE — Telephone Encounter (Signed)
Pt notified of [place] results from 01/26/19. Pt verbalized understanding.    Hoyle Sauer, RN

## 2019-02-02 ENCOUNTER — Ambulatory Visit (INDEPENDENT_AMBULATORY_CARE_PROVIDER_SITE_OTHER): Payer: Medicaid Other | Admitting: Obstetrics & Gynecology

## 2019-02-02 ENCOUNTER — Other Ambulatory Visit: Payer: Self-pay

## 2019-02-02 ENCOUNTER — Encounter (HOSPITAL_BASED_OUTPATIENT_CLINIC_OR_DEPARTMENT_OTHER): Payer: Self-pay | Admitting: Obstetrics & Gynecology

## 2019-02-02 ENCOUNTER — Ambulatory Visit (HOSPITAL_BASED_OUTPATIENT_CLINIC_OR_DEPARTMENT_OTHER): Payer: Self-pay | Admitting: Obstetrics & Gynecology

## 2019-02-02 VITALS — BP 124/82 | Wt 338.0 lb

## 2019-02-02 DIAGNOSIS — E221 Hyperprolactinemia: Secondary | ICD-10-CM

## 2019-02-02 DIAGNOSIS — D259 Leiomyoma of uterus, unspecified: Secondary | ICD-10-CM

## 2019-02-02 DIAGNOSIS — N83209 Unspecified ovarian cyst, unspecified side: Secondary | ICD-10-CM

## 2019-02-02 DIAGNOSIS — D219 Benign neoplasm of connective and other soft tissue, unspecified: Secondary | ICD-10-CM

## 2019-02-02 DIAGNOSIS — N912 Amenorrhea, unspecified: Secondary | ICD-10-CM

## 2019-02-02 DIAGNOSIS — R109 Unspecified abdominal pain: Secondary | ICD-10-CM

## 2019-02-02 DIAGNOSIS — Z6841 Body Mass Index (BMI) 40.0 and over, adult: Secondary | ICD-10-CM

## 2019-02-02 DIAGNOSIS — N970 Female infertility associated with anovulation: Secondary | ICD-10-CM

## 2019-02-02 DIAGNOSIS — R102 Pelvic and perineal pain: Secondary | ICD-10-CM

## 2019-02-02 MED ORDER — MEDROXYPROGESTERONE 10 MG TABLET
ORAL_TABLET | ORAL | 5 refills | Status: AC
Start: 2019-02-02 — End: ?

## 2019-02-02 MED ORDER — CABERGOLINE 0.5 MG TABLET
0.2500 mg | ORAL_TABLET | ORAL | 1 refills | Status: AC
Start: 2019-02-05 — End: ?

## 2019-02-02 NOTE — Telephone Encounter (Signed)
Dr. Ander Purpura ordered to refer pt to GI re abdominal pain and bloating. Referral entered.    Hoyle Sauer, RN

## 2019-02-02 NOTE — Progress Notes (Signed)
OB-GYN Progress Note      History of Present Illness  43 year old female who presents for follow-up regarding abdominal/pelvic pain,, 3 week duration described as pressures in stations and sensation of something "moving in the abdomen".  LMP 08/04/2018.  Patient questions if she is pregnant.  ED evaluation 01/15/2019 with negative HCG, CT abdomen/pelvis negative. Patient was seen in the office 01/25/2019.  Benign physical examination.  Workup revealed UTI treated, FSH 1.6, prolactin 57, hCG negative, GC chlamydia negative, prior TSH normal with PCP per patient.  Ultrasound 01/26/2019 with 2.9 centimeter simple cyst right ovary consistent with a follicle, and 2 small fibroids posterior uterus, incidental findings. Noted prior fasting insulin glucose normal 09/20/2017.    Patient counseled in the above regard.  We discussed hyperprolactinemia.  Patient has no galactorrhea.  Recommended Rx Dostinex twice weekly.  Recommended patient has eye examination with emphasis on peripheral visual field assessment.  Counseled regarding need for endometrial protection in setting of amenorrhea with chronic anovulation.  Recommended Rx Provera 10 milligram daily times 10 days each month of menses 1 week late, that is 1 week beyond average 28 day interval.  Also recommend referral to GI MD to assess abdominal symptoms and sensation of "movement" in the abdomen which is bowel.  I told the patient she is not pregnant.    History    Past Medical History:   Diagnosis Date   . Anxiety    . Depression    . HTN (hypertension)    . Hypothyroidism          Past Surgical History:   Procedure Laterality Date   . [place] 2012   . HX WISDOM TEETH EXTRACTION       No Known Allergies  cholecalciferol, vitamin D3, (VITAMIN D3 ORAL), Take by mouth  escitalopram oxalate (LEXAPRO) 20 mg Oral Tablet, Take 20 mg by mouth Once a day  levothyroxine (SYNTHROID) 25 mcg Oral Tablet, Take 0.5 Tabs (12.5 mcg total) by mouth Every  morning  lisinopriL (PRINIVIL) 5 mg Oral Tablet, Take 5 mg by mouth Once a day  medroxyPROGESTERone (PROVERA) 10 mg Oral Tablet, Take 1 Tab (10 mg total) by mouth Once a day for 10 days (Patient not taking: Reported on 02/02/2019)    No facility-administered medications prior to visit.   .med  Social History     Socioeconomic History   . Marital status: Single     Spouse name: Not on file   . Number of children: Not on file   . Years of education: Not on file   . Highest education level: Not on file   Occupational History   . Not on file   Social Needs   . Financial resource strain: Not on file   . Food insecurity     Worry: Not on file     Inability: Not on file   . Transportation needs     Medical: Not on file     Non-medical: Not on file   Tobacco Use   . Smoking status: Never Smoker   . Smokeless tobacco: Never Used   Substance and Sexual Activity   . Alcohol use: Not on file   . Drug use: Not on file   . Sexual activity: Not on file   Lifestyle   . Physical activity     Days per week: Not on file     Minutes per session: Not on file   .  Stress: Not on file   Relationships   . Social Product manager on phone: Not on file     Gets together: Not on file     Attends religious service: Not on file     Active member of club or organization: Not on file     Attends meetings of clubs or organizations: Not on file     Relationship status: Not on file   . Intimate partner violence     Fear of current or ex partner: Not on file     Emotionally abused: Not on file     Physically abused: Not on file     Forced sexual activity: Not on file   Other Topics Concern   . Not on file   Social History Narrative   . Not on file     Family Medical History:     Problem Relation (Age of Onset)    Clotting Disorder Maternal Grandmother    Diabetes Paternal Grandmother, Maternal Grandmother    Hypertension (High Blood Pressure) Maternal Grandmother, Mother    Uterine Fibroids Mother            OB History     Gravida   2    Para   2     Term   2    Preterm        AB        Living   2       SAB        TAB        Ectopic        Multiple        Live Births   2                 ROS  General: Negative for appetite loss, chills, fever and sweats  Skin: Negative for hives, pruritus and rash  HEENT: Negative for headache and blurred vision  Neck: Negative for neck pain or stiffness  Respiratory: Negative for shortness of breath and cough  Breast: Negative for breast mass and nipple discharge  Cardiovascular: Negative for chest pain or palpitations  Gastrointestinal: Negative for abdominal pain or consitpation  Genitourinary: Negative for urin ary complaints, vaginal discharge, and UTI     Physical Exam  BP 124/82   Wt (!) 153 kg (338 lb)   BMI 48.50 kg/m         General  Mental Status: dresses appropriately and alert  General Appearance: healthy, no distress  Orientation: Person, Place and Time    Integumentary  General Characteristics:  Normal color, texture and turgor without significant lesions or rashes    Abdomen  Palpation:  soft, nontender, no palpable masses, no guarding or rebound tenderness    Female Genitourinary  Genitalia: Normal external genitalia, no erythema, no discharge, no vaginal discharge, normal vagina, normal cervix, normal uterus, size and consistency, normal adnexa without tenderness.  No palpable pelvic mass.      Assessment  1. Pelvic pain--described as fullness  2.  Hyperprolactinemia--no associated galactorrhea but patient does have chronic anovulation and amenorrhea with LMP 07-2018  3. Chronic anovulation  4. Amenorrhea secondary to chronic anovulation  5. Uterine fibroids--2 fibroids posterior myometrium up to 1.6 cm in size , incidental finding  6. Simple cyst 2.9 cm size right ovary consistent with follicle, incidental finding with spontaneous resolution anticipated.      Plan  1. Dostinex 0.25 milligram twice weekly   2.  Provera 10 milligram 1 p.o. daily times 10 days each month if menses 1 week late  3. Refer to GI  regarding abdominal pain not of gyn etiology  4. Recommended eye examination because of hyperprolactinemia with attention to peripheral visual field assessment  5. Follow-up 8 weeks MD for recheck and well-woman examination.      Rogelia Mire, MD

## 2019-03-14 ENCOUNTER — Ambulatory Visit (INDEPENDENT_AMBULATORY_CARE_PROVIDER_SITE_OTHER): Payer: Self-pay | Admitting: Family

## 2019-03-14 NOTE — H&P (Deleted)
[facility], [facility]  [address]  [place] [address]  [phone]    History and Physical  Name: Mary Gomez  MRN: [id number]  DOB:   Age: 43 y.o.  Date: 03/14/2019  Referring provider: Rogelia Mire, MD    Reason for Visit: No chief complaint on file.    History of Present Illness:  Mary Gomez is a 43 y.o. female who presents today for ***.    01/18/2019 CT Abd - normal  Denies fevers, chills, nausea, vomiting, heartburn, reflux, melena, hematochezia, or unintentional weight loss.      Patient History:  Past Medical History:   Diagnosis Date   . Anxiety    . Depression    . HTN (hypertension)    . Hypothyroidism          Past Surgical History:   Procedure Laterality Date   . [place] 2012   . HX WISDOM TEETH EXTRACTION           Current Outpatient Medications   Medication Sig   . Cabergoline 0.5 mg Oral Tablet Take 0.5 Tabs (0.25 mg total) by mouth Every MON and THURS   . cholecalciferol, vitamin D3, (VITAMIN D3 ORAL) Take by mouth   . escitalopram oxalate (LEXAPRO) 20 mg Oral Tablet Take 20 mg by mouth Once a day   . levothyroxine (SYNTHROID) 25 mcg Oral Tablet Take 0.5 Tabs (12.5 mcg total) by mouth Every morning   . lisinopriL (PRINIVIL) 5 mg Oral Tablet Take 5 mg by mouth Once a day   . medroxyPROGESTERone (PROVERA) 10 mg Oral Tablet One p.o. daily times 10 days each month, if menses is 7 days late     No Known Allergies  Family Medical History:     Problem Relation (Age of Onset)    Clotting Disorder Maternal Grandmother    Diabetes Paternal Grandmother, Maternal Grandmother    Hypertension (High Blood Pressure) Maternal Grandmother, Mother    Uterine Fibroids Mother            Social History     Socioeconomic History   . Marital status: Single     Spouse name: Not on file   . Number of children: Not on file   . Years of education: Not on file   . Highest education level: Not on file   Occupational History   . Not on file   Social Needs   .  Financial resource strain: Not on file   . Food insecurity     Worry: Not on file     Inability: Not on file   . Transportation needs     Medical: Not on file     Non-medical: Not on file   Tobacco Use   . Smoking status: Never Smoker   . Smokeless tobacco: Never Used   Substance and Sexual Activity   . Alcohol use: Not on file   . Drug use: Not on file   . Sexual activity: Not on file   Lifestyle   . Physical activity     Days per week: Not on file     Minutes per session: Not on file   . Stress: Not on file   Relationships   . Social Product manager on phone: Not on file     Gets together: Not on file     Attends religious service: Not on file     Active  member of club or organization: Not on file     Attends meetings of clubs or organizations: Not on file     Relationship status: Not on file   . Intimate partner violence     Fear of current or ex partner: Not on file     Emotionally abused: Not on file     Physically abused: Not on file     Forced sexual activity: Not on file   Other Topics Concern   . Not on file   Social History Narrative   . Not on file     Review of Systems:                                      All other review of systems negative.     Physical Exam:  There were no vitals taken for this visit.      Physical Exam  No results were found from the past 30 days.  No results found for this or any previous visit (from the past 1008 hour(s)).    Assessment and Plan:  No diagnosis found.    No follow-ups on file.      Sheila Oats, APRN  03/14/2019, 10:16    This note may have been partially generated using MModal Fluency Direct system, and there may be some incorrect words, spellings, and punctuation that were not noted in checking the note before saving, though effort was made to avoid such errors.

## 2019-04-06 ENCOUNTER — Other Ambulatory Visit (HOSPITAL_BASED_OUTPATIENT_CLINIC_OR_DEPARTMENT_OTHER): Payer: Self-pay | Admitting: Obstetrics & Gynecology

## 2019-05-30 ENCOUNTER — Other Ambulatory Visit (HOSPITAL_BASED_OUTPATIENT_CLINIC_OR_DEPARTMENT_OTHER): Payer: Self-pay | Admitting: Obstetrics & Gynecology

## 2019-09-08 ENCOUNTER — Other Ambulatory Visit (HOSPITAL_BASED_OUTPATIENT_CLINIC_OR_DEPARTMENT_OTHER): Payer: Self-pay | Admitting: Obstetrics & Gynecology

## 2019-09-12 NOTE — Telephone Encounter (Addendum)
Received refill request from patient's pharmacy for [place].  Called patient about refill request and Informed patient since she missed a few appt's and had not yet repeated a prolactin level the refill request may be denied at this time.  Informed pt Dr. Ander Purpura may want her to do a repeat Prolactin test before Rx is refilled. Informed pt would speak to Dr. Ander Purpura and let her know what his recommendation's are and if Rx was going to be refilled.  After speaking with Dr. Ander Purpura tried contacting pt multiple times along with multiple request for a return call but pt never return call.  Called pt again on 10/19/19 reminding her of a yearly appt scheduled on 10/25/19 and Dr. Javier Docker recommendation to do a repeat prolactin level prior to appt and requested she return my call.  Prolactin order still active in chart.    Donia Ast, [place]

## 2019-10-25 ENCOUNTER — Other Ambulatory Visit (HOSPITAL_BASED_OUTPATIENT_CLINIC_OR_DEPARTMENT_OTHER): Payer: Self-pay | Admitting: Obstetrics & Gynecology

## 2019-11-23 ENCOUNTER — Other Ambulatory Visit (HOSPITAL_BASED_OUTPATIENT_CLINIC_OR_DEPARTMENT_OTHER): Payer: Self-pay | Admitting: Obstetrics & Gynecology

## 2021-03-26 ENCOUNTER — Other Ambulatory Visit (HOSPITAL_COMMUNITY): Payer: Self-pay

## 2021-03-26 DIAGNOSIS — Z1231 Encounter for screening mammogram for malignant neoplasm of breast: Secondary | ICD-10-CM

## 2021-03-30 ENCOUNTER — Encounter (INDEPENDENT_AMBULATORY_CARE_PROVIDER_SITE_OTHER): Payer: Self-pay | Admitting: Orthopaedic Surgery

## 2021-03-30 ENCOUNTER — Other Ambulatory Visit: Payer: Self-pay

## 2021-03-30 ENCOUNTER — Encounter (INDEPENDENT_AMBULATORY_CARE_PROVIDER_SITE_OTHER): Payer: Medicaid Other

## 2021-03-30 ENCOUNTER — Ambulatory Visit (INDEPENDENT_AMBULATORY_CARE_PROVIDER_SITE_OTHER): Payer: Medicaid Other | Admitting: Orthopaedic Surgery

## 2021-03-30 VITALS — Ht 70.0 in | Wt 338.0 lb

## 2021-03-30 DIAGNOSIS — M25561 Pain in right knee: Secondary | ICD-10-CM

## 2021-03-30 DIAGNOSIS — Z6841 Body Mass Index (BMI) 40.0 and over, adult: Secondary | ICD-10-CM

## 2021-03-30 DIAGNOSIS — M25562 Pain in left knee: Secondary | ICD-10-CM

## 2021-03-30 MED ORDER — TRIAMCINOLONE ACETONIDE 40 MG/ML SUSPENSION FOR INJECTION
40.0000 mg | INTRAMUSCULAR | Status: AC
Start: 2021-03-30 — End: 2021-03-30
  Administered 2021-03-30: 40 mg via INTRA_ARTICULAR

## 2021-03-30 MED ORDER — BUPIVACAINE HCL 0.5 % (5 MG/ML) INJECTION SOLUTION
10.0000 mL | Freq: Once | INTRAMUSCULAR | Status: AC
Start: 2021-03-30 — End: 2021-03-30
  Administered 2021-03-30 (×2): 1 mL via INTRAMUSCULAR

## 2021-03-30 NOTE — Progress Notes (Signed)
[facility] Note      Mary Gomez  03/30/2021    Information obtained from: Patient, EMR, and healthcare provider    CC:  Bilateral knee pain    HPI: Mary Gomez is a 45 y.o. White female who presents with the above-mentioned complaint.  Patient states that she has had bilateral knee pain for quite some time.  Left is worse than the right.  States that probably started about a year ago.  Pain is on the anterior aspect of her knee.  This is on both sides.  On the left side she also has some medial-sided pain.  States that she had a hyperextending episode years ago and thinks this is what caused it.  Pain is a 410.  She has been using ibuprofen and ice for relief.  Prolonged walking and bending of the knee increases the pain.    No previous injections surgery or physical therapy.    Allergies:   No Known Allergies    PMH:   Past Medical History:   Diagnosis Date   . Anxiety    . Depression    . HTN (hypertension)    . Hypothyroidism            PSH:  Past Surgical History:   Procedure Laterality Date   . [place] 2012   . HX WISDOM TEETH EXTRACTION             [place]:   Family Medical History:     Problem Relation (Age of Onset)    Clotting Disorder Maternal Grandmother    Diabetes Paternal Grandmother, Maternal Grandmother    Hypertension (High Blood Pressure) Maternal Grandmother, Mother    Uterine Fibroids Mother            Social:   Social History     Tobacco Use   . Smoking status: Never Smoker   . Smokeless tobacco: Never Used       Meds:   Current Outpatient Medications   Medication Sig   . Cabergoline 0.5 mg Oral Tablet Take 0.5 Tabs (0.25 mg total) by mouth Every MON and THURS   . cholecalciferol, vitamin D3, (VITAMIN D3 ORAL) Take by mouth   . escitalopram oxalate (LEXAPRO) 20 mg Oral Tablet Take 20 mg by mouth Once a day   . levothyroxine (SYNTHROID) 25 mcg Oral Tablet Take 0.5 Tabs (12.5 mcg total) by mouth Every morning   . lisinopriL (PRINIVIL)  5 mg Oral Tablet Take 5 mg by mouth Once a day   . medroxyPROGESTERone (PROVERA) 10 mg Oral Tablet One p.o. daily times 10 days each month, if menses is 7 days late         ROS: As per HPI, otherwise all other ROS negative  Negative for fever or chills, SOB, chest pain or changes in bowel or bladder habits    PE:  Vitals: Ht 1.778 m ('5\' 10"'$ )   Wt (!) 153 kg (338 lb)   BMI 48.50 kg/m       General: NAD   HEENT: NCAT   Resp: non labored   CVS: Regular rate   Abd: non distended   Skin: warm and dry   Psych: mood/affect appropriate for clinical situation     Ext:     Bilateral lower Extremity:  She has medial-sided joint line tenderness on the left side.  No joint line tenderness on the right.  Positive  McMurray's on the left.  This is for pain.  She has full range of motion of her bilateral knees.  She has no rest intact in bilateral lower extremities.  Ligamentously stable in her bilateral legs.      Fracture: No    Labs: I have reviewed all current lab results     Imaging:   XR bilateral knees not show any significant arthritis.  No fracture.    Assessment:  Bilateral anterior knee pain, left medial joint line pain suspect meniscal pathology.    Plan:  Injected her left knee today.  Respiratory care some physical therapy to work on modalities and quad strengthening hamstring stretching.  Tylenol ibuprofen as needed.  Ice and heat as needed.  Also recommended Voltaren gel to the left knee.  She will follow up on an as-needed basis.    Blane Ohara, MD  Department of Orthopaedics

## 2021-03-30 NOTE — Nursing Note (Signed)
03/30/2021 09:27       Medication Administration   Initials Jola Babinski, MA    Medication  Kenalog   Medication Dose Given 40 mg   Route of Administration intrarticular   Site Left knee   NDC # [id number]   LOT # [id number]   Expiration date 08/12/2022   Manufacturer [place] Yes   Patient Supplied No   Comments: injection by provider      03/30/2021 09:27       Medication Administration   Initials Jola Babinski, MA     Medication  Bupivacaine   Medication Dose Given 0.5%   Route of Administration intraarticular   Site Left knee   NDC # [id number]   LOT # [id number]   Expiration date 11/09/2024   Manufacturer [employer]   Clinic Supplied Yes   Patient Supplied No   Comments: provider injected

## 2021-03-30 NOTE — Procedures (Signed)
[facility]  [address]  [place] [address]    Procedure Note    Name: Mary Gomez MRN:  [id number]   Date: 03/30/2021 Age: 45 y.o.       Large Joint Arthrocentesis: L knee on 03/30/2021 9:31 AM  Indications: pain  Details: 18 G needle, superolateral approach  Medications: (40 Kenalog and 1 cc of Sensorcaine)  Outcome: tolerated well, no immediate complications  Procedure, treatment alternatives, risks and benefits explained, specific risks discussed. Consent was given by the patient. Patient was prepped and draped in the usual sterile fashion.           Blane Ohara, MD

## 2021-04-07 ENCOUNTER — Ambulatory Visit (HOSPITAL_BASED_OUTPATIENT_CLINIC_OR_DEPARTMENT_OTHER): Payer: Medicaid Other

## 2021-04-10 ENCOUNTER — Encounter (HOSPITAL_BASED_OUTPATIENT_CLINIC_OR_DEPARTMENT_OTHER): Payer: Self-pay

## 2021-04-10 ENCOUNTER — Ambulatory Visit
Admission: RE | Admit: 2021-04-10 | Discharge: 2021-04-10 | Disposition: A | Discharge status: Home / Self Care | Payer: Medicaid Other | Source: Ambulatory Visit

## 2021-04-10 ENCOUNTER — Other Ambulatory Visit: Payer: Self-pay

## 2021-04-10 DIAGNOSIS — Z1231 Encounter for screening mammogram for malignant neoplasm of breast: Secondary | ICD-10-CM

## 2021-11-12 ENCOUNTER — Encounter (HOSPITAL_BASED_OUTPATIENT_CLINIC_OR_DEPARTMENT_OTHER): Payer: Self-pay | Admitting: Nurse Practitioner

## 2021-11-12 ENCOUNTER — Ambulatory Visit (INDEPENDENT_AMBULATORY_CARE_PROVIDER_SITE_OTHER): Payer: No Typology Code available for payment source | Admitting: Nurse Practitioner

## 2021-11-12 VITALS — BP 113/65 | HR 80 | Resp 12 | Ht 70.0 in | Wt 337.0 lb

## 2021-11-12 DIAGNOSIS — I10 Essential (primary) hypertension: Secondary | ICD-10-CM

## 2021-11-12 DIAGNOSIS — Z1329 Encounter for screening for other suspected endocrine disorder: Secondary | ICD-10-CM

## 2021-11-12 DIAGNOSIS — L719 Rosacea, unspecified: Secondary | ICD-10-CM | POA: Diagnosis not present

## 2021-11-12 DIAGNOSIS — F324 Major depressive disorder, single episode, in partial remission: Secondary | ICD-10-CM

## 2021-11-12 DIAGNOSIS — Z130 Encounter for screening for diseases of the blood and blood-forming organs and certain disorders involving the immune mechanism: Secondary | ICD-10-CM

## 2021-11-12 DIAGNOSIS — Z1321 Encounter for screening for nutritional disorder: Secondary | ICD-10-CM

## 2021-11-12 DIAGNOSIS — F411 Generalized anxiety disorder: Secondary | ICD-10-CM

## 2021-11-12 DIAGNOSIS — L718 Other rosacea: Secondary | ICD-10-CM | POA: Diagnosis not present

## 2021-11-12 DIAGNOSIS — E039 Hypothyroidism, unspecified: Secondary | ICD-10-CM

## 2021-11-12 DIAGNOSIS — G5701 Lesion of sciatic nerve, right lower limb: Secondary | ICD-10-CM

## 2021-11-12 DIAGNOSIS — Z13228 Encounter for screening for other metabolic disorders: Secondary | ICD-10-CM

## 2021-11-12 MED ORDER — METRONIDAZOLE 0.75 % EX GEL: 1.0000 "application " | Freq: Two times a day (BID) | CUTANEOUS | 11 refills | Status: DC

## 2021-11-12 MED ORDER — CYCLOBENZAPRINE HCL 10 MG PO TABS
10.0000 mg | ORAL_TABLET | Freq: Every day | ORAL | 1 refills | Status: DC
Start: 2021-11-12 — End: 2023-01-21

## 2021-11-12 MED ORDER — BRIMONIDINE TARTRATE 0.33 % EX GEL: CUTANEOUS | 11 refills | Status: DC

## 2021-11-12 NOTE — Progress Notes (Signed)
?Orma Render, DNP, AGNP-c ?Primary Care & Sports Medicine ?[place], [place] [address] ?[phone] ? ?New patient visit ? ? ?Patient: Taylor Solis   DOB: 1975/12/05   46 y.o. Female  MRN: [id number] ?Visit Date: 11/12/2021 ? ?Patient Care Team: ?Phebe Dettmer, Coralee Pesa, NP as PCP - General (Nurse Practitioner) ? ?Today's Vitals  ? 11/12/21 1324  ?Pulse: 80  ?SpO2: 94%  ?Weight: (!) 337 lb (152.9 kg)  ?Height: 5\' 10"  (1.778 m)  ? ?Body mass index is 48.35 kg/m?.  ? ?Today's healthcare provider: Orma Render, NP  ? ?Chief Complaint  ?Patient presents with  ? Rosacea  ? Back spasms  ? ?Subjective  ?  ?Taylor Solis is a 46 y.o. female who presents today as a new patient to establish care.  ?  ?Patient endorses the following concerns presently: ?Rosacia ?Started in 2016 during pregnancy ?Has been managed with OTC medication until recently however it has been worsening and she feels that the symptoms are not well controlled. ?She would like to know what medication options are available for treatment to help reduce the redness and breakouts she is experiencing. ? ?Back spasms ?She endorses spasms in her back that started around her tailbone and radiates through the buttock and down into the thigh. ?She tells me that these are very painful and can stop her in her tracks. ?She endorses increased pain with sitting for long periods and when lifting and bending ?She has not had any injury that she is aware of. ?No swelling, deformity, erythema, warmth to the area ? ?History reviewed and reveals the following: ?History reviewed. No pertinent past medical history. ?History reviewed. No pertinent surgical history. ?No family status information on file.  ? ?History reviewed. No pertinent family history. ?Social History  ? ?Socioeconomic History  ? Marital status: Significant Other  ?  Spouse name: Not on file  ? Number of children: Not on file  ? Years of education: Not on file   ? Highest education level: Not on file  ?Occupational History  ? Not on file  ?Tobacco Use  ? Smoking status: Not on file  ? Smokeless tobacco: Not on file  ?Substance and Sexual Activity  ? Alcohol use: Not on file  ? Drug use: Not on file  ? Sexual activity: Not on file  ?Other Topics Concern  ? Not on file  ?Social History Narrative  ? Not on file  ? ?Social Determinants of Health  ? ?Financial Resource Strain: Not on file  ?Food Insecurity: Not on file  ?Transportation Needs: Not on file  ?Physical Activity: Not on file  ?Stress: Not on file  ?Social Connections: Not on file  ? ?Outpatient Medications Prior to Visit  ?Medication Sig  ? escitalopram (LEXAPRO) 20 MG tablet Take 20 mg by mouth daily.  ? busPIRone (BUSPAR) 10 MG tablet Take 10 mg by mouth daily.  ? hydrochlorothiazide (HYDRODIURIL) 12.5 MG tablet Take 12.5 mg by mouth daily.  ? levothyroxine (SYNTHROID) 75 MCG tablet Take 75 mcg by mouth daily.  ? lisinopril (ZESTRIL) 10 MG tablet Take 10 mg by mouth daily.  ? Vitamin D, Ergocalciferol, (DRISDOL) 1.25 MG (50000 UNIT) CAPS capsule Take by mouth.  ? ?No facility-administered medications prior to visit.  ? ?Not on File ? ?There is no immunization history on file for this patient. ? ?Review of Systems ?All review of systems negative except what is listed in the HPI ? ? Objective  ?  ?  Pulse 80   Ht 5\' 10"  (1.778 m)   Wt (!) 337 lb (152.9 kg)   SpO2 94%   BMI 48.35 kg/m?  ?Physical Exam ?Vitals and nursing note reviewed.  ?Constitutional:   ?   General: She is not in acute distress. ?   Appearance: Normal appearance.  ?Eyes:  ?   Extraocular Movements: Extraocular movements intact.  ?   Conjunctiva/sclera: Conjunctivae normal.  ?   Pupils: Pupils are equal, round, and reactive to light.  ?Neck:  ?   Vascular: No carotid bruit.  ?Cardiovascular:  ?   Rate and Rhythm: Normal rate and regular rhythm.  ?   Pulses: Normal pulses.  ?   Heart sounds: Normal heart sounds. No murmur heard. ?Pulmonary:  ?    Effort: Pulmonary effort is normal.  ?   Breath sounds: Normal breath sounds. No wheezing.  ?Abdominal:  ?   General: Bowel sounds are normal.  ?   Palpations: Abdomen is soft.  ?Musculoskeletal:     ?   General: Tenderness present. Normal range of motion.  ?   Cervical back: Normal range of motion.  ?     Back: ? ?   Right lower leg: No edema.  ?   Left lower leg: No edema.  ?Skin: ?   General: Skin is warm and dry.  ?   Capillary Refill: Capillary refill takes less than 2 seconds.  ?   Findings: Rash present.  ?   Comments: Erythematous rash present to the cheeks bilaterally and chin.  Keratosis is present.  ?Neurological:  ?   General: No focal deficit present.  ?   Mental Status: She is alert and oriented to person, place, and time.  ?Psychiatric:     ?   Mood and Affect: Mood normal.     ?   Behavior: Behavior normal.     ?   Thought Content: Thought content normal.     ?   Judgment: Judgment normal.  ? ? ?No results found for any visits on 11/12/21. ? Assessment & Plan   ?  ? ?Problem List Items Addressed This Visit   ? ? Rosacea, acne - Primary  ?  Erythematous bilateral rash on the face and chin consistent with rosacea.  Previously well controlled with topical over-the-counter medications.  At this time control does not appear to be present.  We will send treatment with metronidazole gel and brimonidine titrate for use to help control redness and pustules.  Encourage patient to avoid direct sunlight and use appropriate moisturizer and sunscreen.  Patient will follow-up if symptoms worsen or fail to improve. ? ?  ?  ? Relevant Medications  ? metroNIDAZOLE (METROGEL) 0.75 % gel  ? Piriformis syndrome, right  ?  Symptoms and presentation consistent with piriformis syndrome. ?Recommend gentle stretching exercises that may help reduce pain.  Also will send in prescription for Flexeril which may help relax the muscle and reduce spasming.  We will also send prescription in for TENS unit which can be significantly  helpful to reduce spasming and pain associated with this. ?If symptoms worsen or fail to improve with recommended treatments will consider referral for further evaluation and possibly formal physical therapy. ? ?  ?  ? Relevant Medications  ? busPIRone (BUSPAR) 10 MG tablet  ? escitalopram (LEXAPRO) 20 MG tablet  ? cyclobenzaprine (FLEXERIL) 10 MG tablet  ? Acquired hypothyroidism  ?  Chronic.  Previously well controlled on levothyroxine 75 mcg daily.  No alarm symptoms present today. ?We will monitor labs today for further evaluation and make recommendations to changes in the plan of care as necessary. ? ?  ?  ? Relevant Medications  ? levothyroxine (SYNTHROID) 75 MCG tablet  ? Other Relevant Orders  ? TSH  ? T4, free  ? Primary hypertension  ?  Chronic.  Well-controlled today.  Blood pressure 113/65 on evaluation.  No alarm symptoms present at this time.  Recommend continuation of current medications and close monitoring.  We will obtain labs today for evaluation and make changes in plan of care based on findings. ? ?  ?  ? Relevant Medications  ? lisinopril (ZESTRIL) 10 MG tablet  ? hydrochlorothiazide (HYDRODIURIL) 12.5 MG tablet  ? Other Relevant Orders  ? CBC with Differential  ? Comprehensive metabolic panel  ? HgB A1c  ? Vitamin D 1,25 dihydroxy  ? TSH  ? T4, free  ? Generalized anxiety disorder  ?  Chronic.  Well-controlled at this time.  We will send refills today on Lexapro and buspirone.  No alarm symptoms present at this time.  We will follow-up if symptoms worsen or new symptoms present. ? ?  ?  ? Relevant Medications  ? busPIRone (BUSPAR) 10 MG tablet  ? escitalopram (LEXAPRO) 20 MG tablet  ? Depression, major, single episode, in partial remission ([place])  ?  Chronic.  Well-controlled on current regimen.  Continue to take Lexapro and buspirone.  We will send refills today.  Follow-up if symptoms worsen or new symptoms develop. ? ?  ?  ? Relevant Medications  ? busPIRone (BUSPAR) 10 MG tablet  ?  escitalopram (LEXAPRO) 20 MG tablet  ? ?Other Visit Diagnoses   ? ? Rosacea keratitis      ? Relevant Medications  ? Brimonidine Tartrate 0.33 % GEL  ? Screening for endocrine, nutritional, metabolic and immunity di

## 2021-11-12 NOTE — Patient Instructions (Signed)
Thank you for choosing [place] [facility] for your Primary Care needs. I am excited for the opportunity to partner with you to meet your health care goals. It was a pleasure meeting you today! ? ?Recommendations from today's visit: ?I have sent in medication for the rosacea. If you don't feel like this is making a difference then please let me know.  ?I recommend using a face wash with sulfur in it and a good moisturizer, especially at bedtime.  ?I have included information and stretches on the back of this sheet for the sciatic pain and piriformis syndrome for you to review. If the symptoms do not improve with the stretches and muscle relaxer, please let me know and we can proceed with orthopedics evaluation.  ?I will send the order in for the TENS unit. If this is not well covered by our insurance then feel free to look on [place] or the pharmacy to see if you find one that is more cost effective.  ? ?Information on diet, exercise, and health maintenance recommendations are listed below. This is information to help you be sure you are on track for optimal health and monitoring.  ? ?Please look over this and let us know if you have any questions or if you have completed any of the health maintenance outside of [place] so that we can be sure your records are up to date.  ?___________________________________________________________ ?About Me: ?I am an Adult-Geriatric Nurse Practitioner with a background in caring for patients for more than 20 years with a strong intensive care background. I provide primary care and sports medicine services to patients age 20 and older within this office. My education had a strong focus on caring for the older adult population, which I am passionate about. I am also the director of the APP Fellowship with [facility].  ? ?My desire is to provide you with the best service through preventive medicine and supportive care. I consider you a part of the  medical team and value your input. I work diligently to ensure that you are heard and your needs are met in a safe and effective manner. I want you to feel comfortable with me as your provider and want you to know that your health concerns are important to me. ? ?For your information, our office hours are: ?Monday, Tuesday, and Thursday 8:00 AM - 5:00 PM ?Wednesday and Friday 8:00 AM - 12:00 PM.  ? ?In my time away from the office I am teaching new APP's within the system and am unavailable, but my partner, Dr. Burnard Bunting is in the office for emergent needs.  ? ?If you have questions or concerns, please call our office at [phone] or send [place] a MyChart message and we will respond as quickly as possible.  ?____________________________________________________________ ?MyChart:  ?For all urgent or time sensitive needs we ask that you please call the office to avoid delays. Our number is [phone]. ?MyChart is not constantly monitored and due to the large volume of messages a day, replies may take up to 72 business hours. ? ?MyChart Policy: ?MyChart allows for you to see your visit notes, after visit summary, provider recommendations, lab and tests results, make an appointment, request refills, and contact your provider or the office for non-urgent questions or concerns. Providers are seeing patients during normal business hours and do not have built in time to review MyChart messages.  ?We ask that you allow a minimum of 3 business days for responses  to [employer]. For this reason, please do not send urgent requests through [place]. Please call the office at [phone]. ?New and ongoing conditions may require a visit. We have virtual and in person visit available for your convenience.  ?Complex MyChart concerns may require a visit. Your provider may request you schedule a virtual or in person visit to ensure we are providing the best care possible. ?MyChart messages sent after 11:00 AM on Friday will not be  received by the provider until Monday morning.  ?  ?Lab and Test Results: ?You will receive your lab and test results on MyChart as soon as they are completed and results have been sent by the lab or testing facility. Due to this service, you will receive your results BEFORE your provider.  ?I review lab and tests results each morning prior to seeing patients. Some results require collaboration with other providers to ensure you are receiving the most appropriate care. For this reason, we ask that you please allow a minimum of 3-5 business days from the time the ALL results have been received for your provider to receive and review lab and test results and contact you about these.  ?Most lab and test result comments from the provider will be sent through [place]. Your provider may recommend changes to the plan of care, follow-up visits, repeat testing, ask questions, or request an office visit to discuss these results. You may reply directly to this message or call the office at [phone] to provide information for the provider or set up an appointment. ?In some instances, you will be called with test results and recommendations. Please let us know if this is preferred and we will make note of this in your chart to provide this for you.    ?If you have not heard a response to your lab or test results in 5 business days from all results returning to [place], please call the office to let us know. We ask that you please avoid calling prior to this time unless there is an emergent concern. Due to high call volumes, this can delay the resulting process. ? ?After Hours: ?For all non-emergency after hours needs, please call the office at [phone] and select the option to reach the on-call provider service. On-call services are shared between multiple [place] offices and therefore it will not be possible to speak directly with your provider. On-call providers may provide medical advice and recommendations, but  are unable to provide refills for maintenance medications.  ?For all emergency or urgent medical needs after normal business hours, we recommend that you seek care at the closest Urgent Care or Emergency Department to ensure appropriate treatment in a timely manner.  ?[facility] [place] has a 24 hour emergency room located on the ground floor for your convenience.  ? ?Urgent Concerns During the Business Day ?Providers are seeing patients from 8AM to [place] with a busy schedule and are most often not able to respond to non-urgent calls until the end of the day or the next business day. ?If you should have URGENT concerns during the day, please call and speak to the nurse or schedule a same day appointment so that we can address your concern without delay.  ? ?Thank you, again, for choosing me as your health care partner. I appreciate your trust and look forward to learning more about you.  ? ?Worthy Keeler, DNP, AGNP-c ?___________________________________________________________ ? ?Health Maintenance Recommendations ?Screening Testing ?Mammogram ?Every 1 -2 years based on history  and risk factors ?Starting at age 51 ?Pap Smear ?Ages 21-39 every 3 years ?Ages 68-65 every 5 years with HPV testing ?More frequent testing may be required based on results and history ?Colon Cancer Screening ?Every 1-10 years based on test performed, risk factors, and history ?Starting at age 24 ?Bone Density Screening ?Every 2-10 years based on history ?Starting at age 75 for women ?Recommendations for men differ based on medication usage, history, and risk factors ?AAA Screening ?One time ultrasound ?Men 36-80 years old who have every smoked ?Lung Cancer Screening ?Low Dose Lung CT every 12 months ?Age 47-80 years with a 30 pack-year smoking history who still smoke or who have quit within the last 15 years ? ?Screening Labs ?Routine  Labs: Complete Blood Count (CBC), Complete Metabolic Panel (CMP), Cholesterol (Lipid  Panel) ?Every 6-12 months based on history and medications ?May be recommended more frequently based on current conditions or previous results ?Hemoglobin A1c Lab ?Every 3-12 months based on history and previous r

## 2021-11-20 ENCOUNTER — Encounter (HOSPITAL_BASED_OUTPATIENT_CLINIC_OR_DEPARTMENT_OTHER): Payer: Self-pay | Admitting: Nurse Practitioner

## 2021-11-20 DIAGNOSIS — G5701 Lesion of sciatic nerve, right lower limb: Secondary | ICD-10-CM | POA: Insufficient documentation

## 2021-11-20 DIAGNOSIS — I10 Essential (primary) hypertension: Secondary | ICD-10-CM | POA: Insufficient documentation

## 2021-11-20 DIAGNOSIS — F411 Generalized anxiety disorder: Secondary | ICD-10-CM | POA: Insufficient documentation

## 2021-11-20 DIAGNOSIS — F324 Major depressive disorder, single episode, in partial remission: Secondary | ICD-10-CM | POA: Insufficient documentation

## 2021-11-20 DIAGNOSIS — E039 Hypothyroidism, unspecified: Secondary | ICD-10-CM | POA: Insufficient documentation

## 2021-11-20 DIAGNOSIS — L719 Rosacea, unspecified: Secondary | ICD-10-CM | POA: Insufficient documentation

## 2021-11-20 NOTE — Assessment & Plan Note (Signed)
Chronic.  Well-controlled on current regimen.  Continue to take Lexapro and buspirone.  We will send refills today.  Follow-up if symptoms worsen or new symptoms develop. ?

## 2021-11-20 NOTE — Assessment & Plan Note (Signed)
Symptoms and presentation consistent with piriformis syndrome. ?Recommend gentle stretching exercises that may help reduce pain.  Also will send in prescription for Flexeril which may help relax the muscle and reduce spasming.  We will also send prescription in for TENS unit which can be significantly helpful to reduce spasming and pain associated with this. ?If symptoms worsen or fail to improve with recommended treatments will consider referral for further evaluation and possibly formal physical therapy. ?

## 2021-11-20 NOTE — Assessment & Plan Note (Signed)
Erythematous bilateral rash on the face and chin consistent with rosacea.  Previously well controlled with topical over-the-counter medications.  At this time control does not appear to be present.  We will send treatment with metronidazole gel and brimonidine titrate for use to help control redness and pustules.  Encourage patient to avoid direct sunlight and use appropriate moisturizer and sunscreen.  Patient will follow-up if symptoms worsen or fail to improve. ?

## 2021-11-20 NOTE — Assessment & Plan Note (Signed)
Chronic.  Well-controlled today.  Blood pressure 113/65 on evaluation.  No alarm symptoms present at this time.  Recommend continuation of current medications and close monitoring.  We will obtain labs today for evaluation and make changes in plan of care based on findings. ?

## 2021-11-20 NOTE — Assessment & Plan Note (Signed)
Chronic.  Previously well controlled on levothyroxine 75 mcg daily.  No alarm symptoms present today. ?We will monitor labs today for further evaluation and make recommendations to changes in the plan of care as necessary. ?

## 2021-11-20 NOTE — Assessment & Plan Note (Signed)
Chronic.  Well-controlled at this time.  We will send refills today on Lexapro and buspirone.  No alarm symptoms present at this time.  We will follow-up if symptoms worsen or new symptoms present. ?

## 2021-11-22 LAB — COMPREHENSIVE METABOLIC PANEL
ALT: 22 IU/L (ref 0–32)
AST: 16 IU/L (ref 0–40)
Albumin/Globulin Ratio: 1.6 (ref 1.2–2.2)
Albumin: 4.4 g/dL (ref 3.8–4.8)
Alkaline Phosphatase: 102 IU/L (ref 44–121)
BUN/Creatinine Ratio: 15 (ref 9–23)
BUN: 12 mg/dL (ref 6–24)
Bilirubin Total: 0.4 mg/dL (ref 0.0–1.2)
CO2: 25 mmol/L (ref 20–29)
Calcium: 9.6 mg/dL (ref 8.7–10.2)
Chloride: 98 mmol/L (ref 96–106)
Creatinine, Ser: 0.79 mg/dL (ref 0.57–1.00)
Globulin, Total: 2.8 g/dL (ref 1.5–4.5)
Glucose: 92 mg/dL (ref 70–99)
Potassium: 3.9 mmol/L (ref 3.5–5.2)
Sodium: 139 mmol/L (ref 134–144)
Total Protein: 7.2 g/dL (ref 6.0–8.5)
eGFR: 92 mL/min/{1.73_m2} (ref >59)

## 2021-11-22 LAB — CBC WITH DIFFERENTIAL/PLATELET
Basophils Absolute: 0 10*3/uL (ref 0.0–0.2)
Basos: 0 %
EOS (ABSOLUTE): 0.1 10*3/uL (ref 0.0–0.4)
Eos: 1 %
Hematocrit: 40.4 % (ref 34.0–46.6)
Hemoglobin: 13.5 g/dL (ref 11.1–15.9)
Immature Grans (Abs): 0 10*3/uL (ref 0.0–0.1)
Immature Granulocytes: 0 %
Lymphocytes Absolute: 2.4 10*3/uL (ref 0.7–3.1)
Lymphs: 28 %
MCH: 27.3 pg (ref 26.6–33.0)
MCHC: 33.4 g/dL (ref 31.5–35.7)
MCV: 82 fL (ref 79–97)
Monocytes Absolute: 0.6 10*3/uL (ref 0.1–0.9)
Monocytes: 7 %
Neutrophils Absolute: 5.5 10*3/uL (ref 1.4–7.0)
Neutrophils: 64 %
Platelets: 309 10*3/uL (ref 150–450)
RBC: 4.94 x10E6/uL (ref 3.77–5.28)
RDW: 13.7 % (ref 11.7–15.4)
WBC: 8.5 10*3/uL (ref 3.4–10.8)

## 2021-11-22 LAB — VITAMIN D 1,25 DIHYDROXY
Vitamin D 1, 25 (OH)2 Total: 48 pg/mL
Vitamin D2 1, 25 (OH)2: 28 pg/mL
Vitamin D3 1, 25 (OH)2: 20 pg/mL

## 2021-11-22 LAB — HEMOGLOBIN A1C
Est. average glucose Bld gHb Est-mCnc: 114 mg/dL
Hgb A1c MFr Bld: 5.6 % (ref 4.8–5.6)

## 2021-11-22 LAB — TSH: TSH: 1.05 u[IU]/mL (ref 0.450–4.500)

## 2021-11-22 LAB — T4, FREE: Free T4: 1.54 ng/dL (ref 0.82–1.77)

## 2021-11-26 ENCOUNTER — Other Ambulatory Visit (HOSPITAL_BASED_OUTPATIENT_CLINIC_OR_DEPARTMENT_OTHER): Payer: Self-pay | Admitting: Nurse Practitioner

## 2021-12-01 MED ORDER — BUSPIRONE HCL 10 MG PO TABS
10.0000 mg | ORAL_TABLET | Freq: Every day | ORAL | 3 refills | Status: DC
  Filled 2021-12-01: qty 90, 90d supply, fill #0
  Filled 2022-03-08: qty 90, 90d supply, fill #1
  Filled 2022-06-26 (×2): qty 90, 90d supply, fill #2
  Filled 2022-10-11: qty 30, 30d supply, fill #3
  Filled 2022-11-19: qty 30, 30d supply, fill #4
  Filled 2022-11-27: qty 30, 30d supply, fill #5

## 2021-12-01 MED ORDER — LEVOTHYROXINE SODIUM 75 MCG PO TABS
75.0000 ug | ORAL_TABLET | Freq: Every day | ORAL | 3 refills | Status: DC
  Filled 2021-12-01: qty 90, 90d supply, fill #0
  Filled 2022-03-08: qty 90, 90d supply, fill #1
  Filled 2022-06-26 (×2): qty 90, 90d supply, fill #2
  Filled 2022-10-11: qty 90, 90d supply, fill #3

## 2021-12-01 MED ORDER — LISINOPRIL 10 MG PO TABS
10.0000 mg | ORAL_TABLET | Freq: Every day | ORAL | 3 refills | Status: DC
  Filled 2021-12-01: qty 90, 90d supply, fill #0
  Filled 2022-03-08: qty 90, 90d supply, fill #1
  Filled 2022-06-26 (×2): qty 90, 90d supply, fill #2
  Filled 2022-10-11: qty 90, 90d supply, fill #3

## 2021-12-01 MED ORDER — HYDROCHLOROTHIAZIDE 12.5 MG PO TABS
12.5000 mg | ORAL_TABLET | Freq: Every day | ORAL | 3 refills | Status: DC
  Filled 2021-12-01: qty 90, 90d supply, fill #0
  Filled 2022-03-08: qty 90, 90d supply, fill #1
  Filled 2022-06-26 (×2): qty 90, 90d supply, fill #2
  Filled 2022-10-11: qty 90, 90d supply, fill #3

## 2021-12-01 MED ORDER — VITAMIN D (ERGOCALCIFEROL) 1.25 MG (50000 UNIT) PO CAPS
50000.0000 [IU] | ORAL_CAPSULE | ORAL | 0 refills | Status: AC
  Filled 2021-12-01 – 2022-02-17 (×3): qty 12, 84d supply, fill #0

## 2021-12-01 MED ORDER — ESCITALOPRAM OXALATE 20 MG PO TABS
20.0000 mg | ORAL_TABLET | Freq: Every day | ORAL | 3 refills | Status: DC
Start: 2021-12-01 — End: 2022-12-25
  Filled 2021-12-01: qty 90, 90d supply, fill #0
  Filled 2022-03-08: qty 90, 90d supply, fill #1
  Filled 2022-06-26 (×2): qty 90, 90d supply, fill #2
  Filled 2022-10-11: qty 30, 30d supply, fill #3
  Filled 2022-11-19: qty 30, 30d supply, fill #4
  Filled 2022-11-27: qty 30, 30d supply, fill #5

## 2021-12-02 ENCOUNTER — Other Ambulatory Visit (HOSPITAL_BASED_OUTPATIENT_CLINIC_OR_DEPARTMENT_OTHER): Payer: Self-pay

## 2021-12-08 ENCOUNTER — Other Ambulatory Visit (HOSPITAL_BASED_OUTPATIENT_CLINIC_OR_DEPARTMENT_OTHER): Payer: Self-pay

## 2021-12-18 ENCOUNTER — Ambulatory Visit (HOSPITAL_BASED_OUTPATIENT_CLINIC_OR_DEPARTMENT_OTHER)
Admission: RE | Admit: 2021-12-18 | Discharge: 2021-12-18 | Disposition: A | Discharge status: Home / Self Care | Payer: No Typology Code available for payment source | Source: Ambulatory Visit | Attending: Nurse Practitioner | Admitting: Nurse Practitioner

## 2021-12-18 ENCOUNTER — Other Ambulatory Visit (HOSPITAL_BASED_OUTPATIENT_CLINIC_OR_DEPARTMENT_OTHER): Payer: Self-pay | Admitting: Nurse Practitioner

## 2021-12-18 DIAGNOSIS — Z1231 Encounter for screening mammogram for malignant neoplasm of breast: Secondary | ICD-10-CM | POA: Diagnosis not present

## 2021-12-21 ENCOUNTER — Other Ambulatory Visit (HOSPITAL_BASED_OUTPATIENT_CLINIC_OR_DEPARTMENT_OTHER): Payer: Self-pay

## 2021-12-24 ENCOUNTER — Encounter (HOSPITAL_BASED_OUTPATIENT_CLINIC_OR_DEPARTMENT_OTHER): Payer: Self-pay | Admitting: Nurse Practitioner

## 2021-12-24 ENCOUNTER — Other Ambulatory Visit (HOSPITAL_BASED_OUTPATIENT_CLINIC_OR_DEPARTMENT_OTHER): Payer: Self-pay

## 2021-12-24 ENCOUNTER — Ambulatory Visit (INDEPENDENT_AMBULATORY_CARE_PROVIDER_SITE_OTHER): Payer: No Typology Code available for payment source | Admitting: Nurse Practitioner

## 2021-12-24 VITALS — BP 124/88 | HR 80 | Ht 70.0 in | Wt 336.8 lb

## 2021-12-24 DIAGNOSIS — Z6841 Body Mass Index (BMI) 40.0 and over, adult: Secondary | ICD-10-CM

## 2021-12-24 DIAGNOSIS — I10 Essential (primary) hypertension: Secondary | ICD-10-CM | POA: Diagnosis not present

## 2021-12-24 DIAGNOSIS — E039 Hypothyroidism, unspecified: Secondary | ICD-10-CM

## 2021-12-24 MED ORDER — PHENTERMINE HCL 37.5 MG PO CAPS
ORAL_CAPSULE | ORAL | 3 refills | Status: DC
  Filled 2021-12-24: qty 30, 30d supply, fill #0
  Filled 2022-02-17: qty 30, 30d supply, fill #1
  Filled 2022-03-26: qty 30, 30d supply, fill #2
  Filled 2022-05-05: qty 30, 30d supply, fill #3

## 2021-12-24 NOTE — Progress Notes (Signed)
Shawna Clamp, DNP, AGNP-c [facility] [address] [place], [place] [address] [phone] Office [phone] Fax  ESTABLISHED PATIENT- Chronic Health and/or Follow-Up Visit  Blood pressure 124/88, pulse 80, height 5\' 10"  (1.778 m), weight (!) 336 lb 12.8 oz (152.8 kg), SpO2 97 %.  Obesity (Pt here for having fatigue for the past year she stated, she feels tired all the time ) and Fatigue   HPI  Taylor Solis  is a 46 y.o. year old female presenting today for evaluation and management of the following: Fatigue Tired all the time Need naps often This has been a new finding over the last year Typically sleeps through the night without interruption  No other significant symptoms to report that could be contributing No rash, fevers, weight loss, joint pain Weight Gain She endorses increased weight gain with extreme difficulty in losing weight despite diet and exercise She has been walking daily and has increased her water intake She has cut sugar out of her diet Despite these changes she has not seen any changes in her weight which is very frustrating She would like to know if there are any medications that could be helpful for her today.  ROS All ROS negative with exception of what is listed in HPI  PHYSICAL EXAM Physical Exam Vitals and nursing note reviewed.  Constitutional:      General: She is not in acute distress.    Appearance: Normal appearance. She is obese.  HENT:     Head: Normocephalic.  Eyes:     Extraocular Movements: Extraocular movements intact.     Conjunctiva/sclera: Conjunctivae normal.     Pupils: Pupils are equal, round, and reactive to light.  Neck:     Vascular: No carotid bruit.  Cardiovascular:     Rate and Rhythm: Normal rate and regular rhythm.     Pulses: Normal pulses.     Heart sounds: Normal heart sounds. No murmur heard. Pulmonary:     Effort: Pulmonary effort is normal.      Breath sounds: Normal breath sounds. No wheezing.  Abdominal:     General: Bowel sounds are normal. There is no distension.     Palpations: Abdomen is soft.     Tenderness: There is no abdominal tenderness. There is no guarding.  Musculoskeletal:        General: Normal range of motion.     Cervical back: Normal range of motion and neck supple.     Right lower leg: No edema.     Left lower leg: No edema.  Lymphadenopathy:     Cervical: No cervical adenopathy.  Skin:    General: Skin is warm and dry.     Capillary Refill: Capillary refill takes less than 2 seconds.  Neurological:     General: No focal deficit present.     Mental Status: She is alert and oriented to person, place, and time.  Psychiatric:        Mood and Affect: Mood normal.        Behavior: Behavior normal.        Thought Content: Thought content normal.        Judgment: Judgment normal.     ASSESSMENT & PLAN Problem List Items Addressed This Visit     Acquired hypothyroidism    Chronic.  Most recent check of thyroid levels approximately 1 month ago with normal findings.  Does not appear that uncontrolled hypothyroidism could be contributing to her increased  fatigue.  No changes to be made to medication today but if patient continues to experience fatigue we can always spotcheck between visits.      Primary hypertension    Blood pressure well controlled at this time.  No changes to medication today.  We will plan to follow-up in the next 6 months for repeat monitoring or sooner if patient develops any signs or symptoms.      BMI 45.0-49.9, adult (HCC) - Primary    BMI 48.33 at visit today.  Patient has been making lifestyle changes with increased activity and dietary changes however has not been seeing a significant change in her weight which is quite frustrating for her.  It is possible that her increased weight is contributing to her fatigue and possibly depressive symptoms that she has been experiencing.  I do  feel that she is making a great effort to better control her weight and does likely need medication assistance to help improve her response.  Discussion with patient about medication options.  Decision made today to trial phentermine to see how this works for her.  Patient aware if anxiety symptoms worsen while using medication to stop immediately and notify the office.  We will plan to follow-up in about 3 months with her next scheduled visit to see how she is doing on this medication.      Relevant Medications   phentermine 37.5 MG capsule     FOLLOW-UP Next visit already scheduled.  Shawna Clamp, DNP, AGNP-c 12/24/2021  4:06 PM

## 2021-12-24 NOTE — Patient Instructions (Signed)
I have sent in the phentermine for you to try for 3 months and then we will touch base and see how you are doing with a phone visit. If at any time you feel like this is not the right option then please let me know.

## 2021-12-31 ENCOUNTER — Encounter (HOSPITAL_BASED_OUTPATIENT_CLINIC_OR_DEPARTMENT_OTHER): Payer: Self-pay | Admitting: Nurse Practitioner

## 2021-12-31 DIAGNOSIS — Z6841 Body Mass Index (BMI) 40.0 and over, adult: Secondary | ICD-10-CM | POA: Insufficient documentation

## 2021-12-31 DIAGNOSIS — E669 Obesity, unspecified: Secondary | ICD-10-CM | POA: Insufficient documentation

## 2022-01-08 NOTE — Assessment & Plan Note (Addendum)
BMI 48.33 at visit today.  Patient has been making lifestyle changes with increased activity and dietary changes however has not been seeing a significant change in her weight which is quite frustrating for her.  It is possible that her increased weight is contributing to her fatigue and possibly depressive symptoms that she has been experiencing.  I do feel that she is making a great effort to better control her weight and does likely need medication assistance to help improve her response.  Discussion with patient about medication options.  Decision made today to trial phentermine to see how this works for her.  Patient aware if anxiety symptoms worsen while using medication to stop immediately and notify the office.  We will plan to follow-up in about 3 months with her next scheduled visit to see how she is doing on this medication.

## 2022-01-08 NOTE — Assessment & Plan Note (Signed)
Chronic.  Most recent check of thyroid levels approximately 1 month ago with normal findings.  Does not appear that uncontrolled hypothyroidism could be contributing to her increased fatigue.  No changes to be made to medication today but if patient continues to experience fatigue we can always spotcheck between visits.

## 2022-01-08 NOTE — Assessment & Plan Note (Signed)
Blood pressure well controlled at this time.  No changes to medication today.  We will plan to follow-up in the next 6 months for repeat monitoring or sooner if patient develops any signs or symptoms.

## 2022-02-17 ENCOUNTER — Other Ambulatory Visit (HOSPITAL_BASED_OUTPATIENT_CLINIC_OR_DEPARTMENT_OTHER): Payer: Self-pay

## 2022-02-17 ENCOUNTER — Encounter (HOSPITAL_BASED_OUTPATIENT_CLINIC_OR_DEPARTMENT_OTHER): Payer: Self-pay | Admitting: Pharmacist

## 2022-03-04 ENCOUNTER — Encounter (HOSPITAL_BASED_OUTPATIENT_CLINIC_OR_DEPARTMENT_OTHER): Payer: Self-pay | Admitting: Pharmacist

## 2022-03-04 ENCOUNTER — Encounter: Payer: Self-pay | Admitting: Physician Assistant

## 2022-03-04 ENCOUNTER — Ambulatory Visit (INDEPENDENT_AMBULATORY_CARE_PROVIDER_SITE_OTHER): Payer: No Typology Code available for payment source | Admitting: Physician Assistant

## 2022-03-04 ENCOUNTER — Other Ambulatory Visit (HOSPITAL_BASED_OUTPATIENT_CLINIC_OR_DEPARTMENT_OTHER): Payer: Self-pay

## 2022-03-04 VITALS — HR 73 | Temp 98.0°F | Ht 70.0 in | Wt 326.8 lb

## 2022-03-04 DIAGNOSIS — H61891 Other specified disorders of right external ear: Secondary | ICD-10-CM

## 2022-03-04 MED ORDER — FLUOCINOLONE ACETONIDE 0.01 % OT OIL
1.0000 [drp] | TOPICAL_OIL | Freq: Two times a day (BID) | OTIC | 0 refills | Status: DC
  Filled 2022-03-04: qty 20, 30d supply, fill #0

## 2022-03-04 NOTE — Progress Notes (Signed)
   Subjective:    Patient ID: Taylor Solis, female    DOB: July 10, 1976, 46 y.o.   MRN: [id number]  Chief Complaint  Patient presents with   ear infection     Pt states tickle in ear for x weeks in rt ear    HPI Patient is in today for right ear canal irritation x several weeks. Feels like something is crawling in there occasionally. Itchy. No drainage. No pain. No hearing loss or tinnitus.   History reviewed. No pertinent past medical history.  History reviewed. No pertinent surgical history.  History reviewed. No pertinent family history.  Social History   Tobacco Use   Smoking status: Never    Passive exposure: Never   Smokeless tobacco: Never  Substance Use Topics   Alcohol use: Not Currently   Drug use: Never     Not on File  Review of Systems NEGATIVE UNLESS OTHERWISE INDICATED IN HPI      Objective:     Pulse 73   Temp 98 F (36.7 C)   Ht 5\' 10"  (1.778 m)   Wt (!) 326 lb 12.8 oz (148.2 kg)   SpO2 97%   BMI 46.89 kg/m   Wt Readings from Last 3 Encounters:  03/04/22 (!) 326 lb 12.8 oz (148.2 kg)  12/24/21 (!) 336 lb 12.8 oz (152.8 kg)  11/12/21 (!) 337 lb (152.9 kg)    BP Readings from Last 3 Encounters:  12/24/21 124/88  11/12/21 113/65     Physical Exam Constitutional:      Appearance: Normal appearance.  HENT:     Right Ear: Tympanic membrane, ear canal and external ear normal. There is no impacted cerumen.     Left Ear: Tympanic membrane, ear canal and external ear normal. There is no impacted cerumen.  Neurological:     Mental Status: She is alert.  Psychiatric:        Mood and Affect: Mood normal.        Behavior: Behavior normal.        Assessment & Plan:   Problem List Items Addressed This Visit   None Visit Diagnoses     Irritation of external ear canal, right    -  Primary        Meds ordered this encounter  Medications   Fluocinolone Acetonide 0.01 % OIL    Sig: Place 1 drop in ear(s) 2 (two) times daily.     Dispense:  20 mL    Refill:  0    Order Specific Question:   Supervising Provider    Answer:   01/12/22 [4514]    Plan: Reassured patient no signs of infection or ETD or cerumen buildup. Possibly some ear canal irritation secondary to environment changes. Trial fluocinolone drops as directed. Recheck prn.  *Please note, BP not checked at today's OV due to incorrect cuff size available   Shantinique Picazo M Maksim Peregoy, PA-C

## 2022-03-05 ENCOUNTER — Other Ambulatory Visit (HOSPITAL_BASED_OUTPATIENT_CLINIC_OR_DEPARTMENT_OTHER): Payer: Self-pay

## 2022-03-08 ENCOUNTER — Ambulatory Visit (INDEPENDENT_AMBULATORY_CARE_PROVIDER_SITE_OTHER): Payer: No Typology Code available for payment source | Admitting: Family Medicine

## 2022-03-08 ENCOUNTER — Other Ambulatory Visit (HOSPITAL_BASED_OUTPATIENT_CLINIC_OR_DEPARTMENT_OTHER): Payer: Self-pay

## 2022-03-08 ENCOUNTER — Encounter: Payer: Self-pay | Admitting: Family Medicine

## 2022-03-08 ENCOUNTER — Encounter (HOSPITAL_BASED_OUTPATIENT_CLINIC_OR_DEPARTMENT_OTHER): Payer: Self-pay | Admitting: Nurse Practitioner

## 2022-03-08 VITALS — BP 132/82 | HR 77 | Temp 97.7°F | Ht 70.0 in | Wt 325.0 lb

## 2022-03-08 DIAGNOSIS — Z1159 Encounter for screening for other viral diseases: Secondary | ICD-10-CM | POA: Diagnosis not present

## 2022-03-08 DIAGNOSIS — Z1211 Encounter for screening for malignant neoplasm of colon: Secondary | ICD-10-CM

## 2022-03-08 DIAGNOSIS — Z0000 Encounter for general adult medical examination without abnormal findings: Secondary | ICD-10-CM

## 2022-03-08 DIAGNOSIS — Z114 Encounter for screening for human immunodeficiency virus [HIV]: Secondary | ICD-10-CM

## 2022-03-08 NOTE — Patient Instructions (Signed)
It was very nice to see you today!  Great work on [employer] it up!!!!!!!!!!!!   PLEASE NOTE:  If you had any lab tests please let us know if you have not heard back within a few days. You may see your results on MyChart before we have a chance to review them but we will give you a call once they are reviewed by [place]. If we ordered any referrals today, please let us know if you have not heard from their office within the next week.   Please try these tips to maintain a healthy lifestyle:  Eat most of your calories during the day when you are active. Eliminate processed foods including packaged sweets (pies, cakes, cookies), reduce intake of potatoes, white bread, white pasta, and white rice. Look for whole grain options, oat flour or almond flour.  Each meal should contain half fruits/vegetables, one quarter protein, and one quarter carbs (no bigger than a computer mouse).  Cut down on sweet beverages. This includes juice, soda, and sweet tea. Also watch fruit intake, though this is a healthier sweet option, it still contains natural sugar! Limit to 3 servings daily.  Drink at least 1 glass of water with each meal and aim for at least 8 glasses per day  Exercise at least 150 minutes every week.

## 2022-03-08 NOTE — Progress Notes (Signed)
Phone [phone]   Subjective:   Patient is a 46 y.o. female presenting for annual physical.    Chief Complaint  Patient presents with   Annual Exam    Fasting   Annual-walking more. Bp's usu 110's/83(2 wks ago).  See problem oriented charting- ROS- ROS: Gen: no fever, chills  Skin: no rash, itching ENT: no ear pain, ear drainage, nasal congestion, rhinorrhea, sinus pressure, sore throat Eyes: no blurry vision, double vision Resp: no cough, wheeze,SOB CV: no CP, palpitations, LE edema,  GI: no heartburn, n/v/d/c, abd pain GU: no dysuria, urgency, frequency, hematuria MSK: chronic back pain Neuro: no dizziness, headache, weakness, vertigo Psych: no depression, anxiety, insomnia, SI - meds holding well.   The following were reviewed and entered/updated in epic: Past Medical History:  Diagnosis Date   Anxiety    Depression    Hypertension    Thyroid disease    Patient Active Problem List   Diagnosis Date Noted   BMI 45.0-49.9, adult (HCC) 12/31/2021   Rosacea, acne 11/20/2021   Piriformis syndrome, right 11/20/2021   Acquired hypothyroidism 11/20/2021   Primary hypertension 11/20/2021   Generalized anxiety disorder 11/20/2021   Depression, major, single episode, in partial remission (HCC) 11/20/2021   Past Surgical History:  Procedure Laterality Date   CESAREAN SECTION     [id number],[id number]   CHOLECYSTECTOMY     2017    History reviewed. No pertinent family history.  Medications- reviewed and updated Current Outpatient Medications  Medication Sig Dispense Refill   Brimonidine Tartrate 0.33 % GEL Apply to face every 12 hours for Rosacea. 30 g 11   busPIRone (BUSPAR) 10 MG tablet Take 1 tablet (10 mg total) by mouth daily. 90 tablet 3   cyclobenzaprine (FLEXERIL) 10 MG tablet Take 1 tablet (10 mg total) by mouth at bedtime. 30 tablet 1   escitalopram (LEXAPRO) 20 MG tablet Take 1 tablet (20 mg total) by mouth daily. 90 tablet 3   hydrochlorothiazide (HYDRODIURIL)  12.5 MG tablet Take 1 tablet (12.5 mg total) by mouth daily. 90 tablet 3   levothyroxine (SYNTHROID) 75 MCG tablet Take 1 tablet (75 mcg total) by mouth daily. 90 tablet 3   lisinopril (ZESTRIL) 10 MG tablet Take 1 tablet (10 mg total) by mouth daily. 90 tablet 3   metroNIDAZOLE (METROGEL) 0.75 % gel Apply 1 application. topically 2 (two) times daily. To face for rosacea. 45 g 11   phentermine 37.5 MG capsule One capsule by mouth qAM 30 capsule 3   Fluocinolone Acetonide 0.01 % OIL Place 1 drop in ear(s) 2 (two) times daily. (Patient not taking: Reported on 03/08/2022) 20 mL 0   No current facility-administered medications for this visit.    Allergies-reviewed and updated No Known Allergies  Social History   Social History Narrative   Not on file   Objective  Objective:  BP 132/82   Pulse 77   Temp 97.7 F (36.5 C) (Temporal)   Ht 5\' 10"  (1.778 m)   Wt (!) 325 lb (147.4 kg)   SpO2 97%   BMI 46.63 kg/m  Physical Exam  Gen: WDWN NAD HEENT: NCAT, conjunctiva not injected, sclera nonicteric TM WNL B, OP moist, no exudates  NECK:  supple, no thyromegaly, no nodes, no carotid bruits CARDIAC: RRR, S1S2+, no murmur. DP 2+B LUNGS: CTAB. No wheezes ABDOMEN:  BS+, soft, NTND, No HSM, no masses EXT:  tr edema LLE MSK: no gross abnormalities. MS 5/5 all 4 NEURO: A&O x3.  CN II-XII intact.  PSYCH: normal mood. Good eye contact   Reviewed labs from May   Assessment and Plan   Health Maintenance counseling: 1. Anticipatory guidance: Patient counseled regarding regular dental exams q6 months, eye exams,  avoiding smoking and second hand smoke, limiting alcohol to 1 beverage per day, no illicit drugs.   2. Risk factor reduction:  Advised patient of need for regular exercise and diet rich and fruits and vegetables to reduce risk of heart attack and stroke. Exercise- working on it.  Wt Readings from Last 3 Encounters:  03/08/22 (!) 325 lb (147.4 kg)  03/04/22 (!) 326 lb 12.8 oz (148.2  kg)  12/24/21 (!) 336 lb 12.8 oz (152.8 kg)   3. Immunizations/screenings/ancillary studies Immunization History  Administered Date(s) Administered   Tdap 06/22/2021   Health Maintenance Due  Topic Date Due   COVID-19 Vaccine (1) Never done   Hepatitis C Screening  Never done   PAP SMEAR-Modifier  Never done   COLONOSCOPY (Pts 45-1yrs Insurance coverage will need to be confirmed)  Never done    4. Cervical cancer screening- due 5. Breast cancer screening-  mammogram utd 6. Colon cancer screening - order cologard 7. Skin cancer screening- advised regular sunscreen use. Denies worrisome, changing, or new skin lesions.  8. Birth control/STD check- none 9. Osteoporosis screening- na 10. Smoking associated screening - none smoker  Problem List Items Addressed This Visit   None Visit Diagnoses     Wellness examination    -  Primary   Relevant Orders   CBC with Differential/Platelet   Comprehensive metabolic panel   Lipid panel   Hepatitis C antibody   HIV Antibody (routine testing w rflx)   Screen for colon cancer       Relevant Orders   Cologuard   Screening for HIV without presence of risk factors       Relevant Orders   HIV Antibody (routine testing w rflx)   Encounter for hepatitis C screening test for low risk patient       Relevant Orders   Hepatitis C antibody     1  Wellness-anticipatory guidance.  Work on [employer].  Check CBC,CMP,lipids  F/u 1 yr .  Ordered cologard.  She will sch w/gyn for pap  Recommended follow up: annual Return for soon labs. Future Appointments  Date Time Provider Department Center  03/26/2022 10:30 AM Early, Sung Amabile, NP [facility]  05/31/2022  4:10 PM Early, Sung Amabile, NP [facility]    Lab/Order associations:+ fasting   ICD-10-CM   1. Wellness examination  Z00.00 CBC with Differential/Platelet    Comprehensive metabolic panel    Lipid panel    Hepatitis C antibody    HIV Antibody (routine testing w rflx)    2. Screen for colon cancer   Z12.11 Cologuard    3. Screening for HIV without presence of risk factors  Z11.4 HIV Antibody (routine testing w rflx)    4. Encounter for hepatitis C screening test for low risk patient  Z11.59 Hepatitis C antibody      No orders of the defined types were placed in this encounter.   Angelena Sole, MD

## 2022-03-09 ENCOUNTER — Other Ambulatory Visit (INDEPENDENT_AMBULATORY_CARE_PROVIDER_SITE_OTHER): Payer: No Typology Code available for payment source

## 2022-03-09 ENCOUNTER — Other Ambulatory Visit (HOSPITAL_BASED_OUTPATIENT_CLINIC_OR_DEPARTMENT_OTHER): Payer: Self-pay

## 2022-03-09 DIAGNOSIS — Z0000 Encounter for general adult medical examination without abnormal findings: Secondary | ICD-10-CM | POA: Diagnosis not present

## 2022-03-09 DIAGNOSIS — E550 Rickets, active: Secondary | ICD-10-CM

## 2022-03-09 DIAGNOSIS — Z114 Encounter for screening for human immunodeficiency virus [HIV]: Secondary | ICD-10-CM

## 2022-03-09 DIAGNOSIS — Z1159 Encounter for screening for other viral diseases: Secondary | ICD-10-CM

## 2022-03-09 LAB — COMPREHENSIVE METABOLIC PANEL
ALT: 18 U/L (ref 0–35)
AST: 17 U/L (ref 0–37)
Albumin: 4.1 g/dL (ref 3.5–5.2)
Alkaline Phosphatase: 81 U/L (ref 39–117)
BUN: 14 mg/dL (ref 6–23)
CO2: 29 mEq/L (ref 19–32)
Calcium: 9.5 mg/dL (ref 8.4–10.5)
Chloride: 102 mEq/L (ref 96–112)
Creatinine, Ser: 0.84 mg/dL (ref 0.40–1.20)
GFR: 82.23 mL/min (ref >60.00)
Glucose, Bld: 104 mg/dL — ABNORMAL HIGH (ref 70–99)
Potassium: 4 mEq/L (ref 3.5–5.1)
Sodium: 141 mEq/L (ref 135–145)
Total Bilirubin: 0.5 mg/dL (ref 0.2–1.2)
Total Protein: 7.5 g/dL (ref 6.0–8.3)

## 2022-03-09 LAB — CBC WITH DIFFERENTIAL/PLATELET
Basophils Absolute: 0 10*3/uL (ref 0.0–0.1)
Basophils Relative: 0.3 % (ref 0.0–3.0)
Eosinophils Absolute: 0.1 10*3/uL (ref 0.0–0.7)
Eosinophils Relative: 1.6 % (ref 0.0–5.0)
HCT: 38.7 % (ref 36.0–46.0)
Hemoglobin: 12.8 g/dL (ref 12.0–15.0)
Lymphocytes Relative: 25.3 % (ref 12.0–46.0)
Lymphs Abs: 1.8 10*3/uL (ref 0.7–4.0)
MCHC: 33.1 g/dL (ref 30.0–36.0)
MCV: 83.3 fl (ref 78.0–100.0)
Monocytes Absolute: 0.5 10*3/uL (ref 0.1–1.0)
Monocytes Relative: 7.4 % (ref 3.0–12.0)
Neutro Abs: 4.7 10*3/uL (ref 1.4–7.7)
Neutrophils Relative %: 65.4 % (ref 43.0–77.0)
Platelets: 292 10*3/uL (ref 150.0–400.0)
RBC: 4.65 Mil/uL (ref 3.87–5.11)
RDW: 14.1 % (ref 11.5–15.5)
WBC: 7.1 10*3/uL (ref 4.0–10.5)

## 2022-03-09 LAB — LIPID PANEL
Cholesterol: 176 mg/dL (ref 0–200)
HDL: 40 mg/dL (ref >39.00)
LDL Cholesterol: 101 mg/dL — ABNORMAL HIGH (ref 0–99)
NonHDL: 135.94
Total CHOL/HDL Ratio: 4
Triglycerides: 177 mg/dL — ABNORMAL HIGH (ref 0.0–149.0)
VLDL: 35.4 mg/dL (ref 0.0–40.0)

## 2022-03-09 MED ORDER — VITAMIN D (ERGOCALCIFEROL) 1.25 MG (50000 UNIT) PO CAPS
50000.0000 [IU] | ORAL_CAPSULE | ORAL | 0 refills | Status: DC
  Filled 2022-03-09: qty 12, 84d supply, fill #0

## 2022-03-10 LAB — HIV ANTIBODY (ROUTINE TESTING W REFLEX): HIV 1&2 Ab, 4th Generation: NONREACTIVE

## 2022-03-10 LAB — HEPATITIS C ANTIBODY: Hepatitis C Ab: NONREACTIVE

## 2022-03-22 LAB — COLOGUARD: Cologuard: NEGATIVE

## 2022-03-24 ENCOUNTER — Other Ambulatory Visit (HOSPITAL_BASED_OUTPATIENT_CLINIC_OR_DEPARTMENT_OTHER): Payer: Self-pay

## 2022-03-26 ENCOUNTER — Other Ambulatory Visit (HOSPITAL_BASED_OUTPATIENT_CLINIC_OR_DEPARTMENT_OTHER): Payer: Self-pay

## 2022-03-26 ENCOUNTER — Telehealth (HOSPITAL_BASED_OUTPATIENT_CLINIC_OR_DEPARTMENT_OTHER): Payer: No Typology Code available for payment source | Admitting: Nurse Practitioner

## 2022-03-28 LAB — COLOGUARD: COLOGUARD: NEGATIVE

## 2022-03-29 ENCOUNTER — Encounter: Payer: Self-pay | Admitting: Nurse Practitioner

## 2022-05-05 ENCOUNTER — Other Ambulatory Visit (HOSPITAL_BASED_OUTPATIENT_CLINIC_OR_DEPARTMENT_OTHER): Payer: Self-pay

## 2022-05-25 ENCOUNTER — Encounter: Payer: No Typology Code available for payment source | Admitting: Radiology

## 2022-05-27 ENCOUNTER — Ambulatory Visit (INDEPENDENT_AMBULATORY_CARE_PROVIDER_SITE_OTHER): Payer: No Typology Code available for payment source | Admitting: Nurse Practitioner

## 2022-05-27 ENCOUNTER — Encounter (HOSPITAL_BASED_OUTPATIENT_CLINIC_OR_DEPARTMENT_OTHER): Payer: Self-pay | Admitting: Nurse Practitioner

## 2022-05-27 ENCOUNTER — Other Ambulatory Visit (HOSPITAL_BASED_OUTPATIENT_CLINIC_OR_DEPARTMENT_OTHER): Payer: Self-pay

## 2022-05-27 VITALS — BP 122/86 | HR 81 | Temp 98.0°F | Ht 70.0 in | Wt 317.0 lb

## 2022-05-27 DIAGNOSIS — Z6841 Body Mass Index (BMI) 40.0 and over, adult: Secondary | ICD-10-CM | POA: Diagnosis not present

## 2022-05-27 DIAGNOSIS — E039 Hypothyroidism, unspecified: Secondary | ICD-10-CM | POA: Diagnosis not present

## 2022-05-27 DIAGNOSIS — F411 Generalized anxiety disorder: Secondary | ICD-10-CM | POA: Diagnosis not present

## 2022-05-27 DIAGNOSIS — F324 Major depressive disorder, single episode, in partial remission: Secondary | ICD-10-CM

## 2022-05-27 DIAGNOSIS — I10 Essential (primary) hypertension: Secondary | ICD-10-CM

## 2022-05-27 MED ORDER — PHENTERMINE HCL 37.5 MG PO CAPS
37.5000 mg | ORAL_CAPSULE | Freq: Every day | ORAL | 5 refills | Status: DC
  Filled 2022-05-27 – 2022-06-11 (×2): qty 30, 30d supply, fill #0
  Filled 2022-07-30: qty 30, 30d supply, fill #1
  Filled 2022-09-07: qty 30, 30d supply, fill #2
  Filled 2022-10-11: qty 30, 30d supply, fill #3
  Filled 2022-11-19: qty 30, 30d supply, fill #4
  Filled 2022-11-23: qty 30, 30d supply, fill #5

## 2022-05-27 NOTE — Patient Instructions (Addendum)
My new office is [facility]. [address], [place]. Their number is [phone]. If you would like to follow me you are welcome to come. Dr. Ihor Dow is taking over my patients that would like to stay here. Tamela Oddi can schedule your follow-up with him if you would like to stay. :-)   I am SO PROUD OF YOU!!! Please keep up the great work and celebrate your success!! Once the wegovy is available, we can get you started on that.

## 2022-05-27 NOTE — Progress Notes (Signed)
Taylor Clamp, DNP, AGNP-c [facility] [address] [place], [place] [address] [phone] Office [phone] Fax  ESTABLISHED PATIENT- Chronic Health and/or Follow-Up Visit  Blood pressure 122/86, pulse 81, temperature 98 F (36.7 C), height 5\' 10"  (1.778 m), weight (!) 317 lb (143.8 kg), SpO2 95 %.    Taylor Solis is a 46 y.o. year old female presenting today for evaluation and management of the following: Follow-up (Needs medication refills)  Weight Management Taylor Solis has been continuing with the phentermine, exercise, and diet management. She endorses good response to the changes she has made with her weight. She and her husband are working on her weight management together and she tells me this has made the journey easier for her. Her weight loss has been slow and steady and she is setting small, manageable goals to reach. She endorses having a little set-back recently with not as much exercise due to feeling a bit down about her mother, she is coming upon the anniversary of her mothers passing and this is a hard time of year for her. She has still managed to loose weight and she tells me that her clothes and her shoe size have gone down.  She would like to stay on the phentermine for now, if possible, but eventually would like to transition to [facility] if the stock becomes available again.   All ROS negative with exception of what is listed above.   PHYSICAL EXAM Physical Exam Vitals and nursing note reviewed.  Constitutional:      General: She is not in acute distress.    Appearance: Normal appearance.  HENT:     Head: Normocephalic.  Eyes:     Extraocular Movements: Extraocular movements intact.     Conjunctiva/sclera: Conjunctivae normal.     Pupils: Pupils are equal, round, and reactive to light.  Neck:     Vascular: No carotid bruit.  Cardiovascular:     Rate and Rhythm: Normal rate and regular rhythm.      Pulses: Normal pulses.     Heart sounds: Normal heart sounds. No murmur heard. Pulmonary:     Effort: Pulmonary effort is normal.     Breath sounds: Normal breath sounds. No wheezing.  Abdominal:     General: Bowel sounds are normal. There is no distension.     Palpations: Abdomen is soft.     Tenderness: There is no abdominal tenderness. There is no guarding.  Musculoskeletal:        General: Normal range of motion.     Cervical back: Normal range of motion and neck supple.     Right lower leg: No edema.     Left lower leg: No edema.  Lymphadenopathy:     Cervical: No cervical adenopathy.  Skin:    General: Skin is warm and dry.     Capillary Refill: Capillary refill takes less than 2 seconds.  Neurological:     General: No focal deficit present.     Mental Status: She is alert and oriented to person, place, and time.  Psychiatric:        Mood and Affect: Mood normal.        Behavior: Behavior normal.        Thought Content: Thought content normal.        Judgment: Judgment normal.     PLAN Problem List Items Addressed This Visit     Acquired hypothyroidism - Primary    chronic. Exam normal today.  current treatment plan effective, no change in therapy and lab orders as documented in EMR. Currently is taking medication. Currently is not followed by endocrinology. No alarm symptoms present at this time. Recommend taking medication on an empty stomach 30 minutes to 1 hour before food or other medications for best absorption. Follow up in 18months or sooner if needed based on lab results and symptoms.         Relevant Orders   CBC with Differential/Platelet   Comprehensive metabolic panel   Hemoglobin A1c   Lipid panel   TSH   T4, free   Primary hypertension    Chronic.  No alarm symptoms present at this time.  Goal blood pressure less than less than 130/80.  Refills  are not needed  today.  Labs today to check electrolytes and kidney function.  Recommend current treatment  plan is effective, no change in therapy, orders and follow up as documented in EpicCare, reviewed diet, exercise and weight control, labs ordered and recent results reviewed with patient. Currently is not followed with cardiology. We will make changes to plan of care as necessary based on lab results. Plan to follow-up in 65months.       Relevant Orders   CBC with Differential/Platelet   Comprehensive metabolic panel   Hemoglobin A1c   Lipid panel   TSH   T4, free   Generalized anxiety disorder    chronic. Anxiety screening reviewed medication list reviewed. Alarm symptoms absent today. Recommend continue current medications and - take medicine as prescribed for ongoing management. Instructed patient to contact office or on-call physician promptly should condition worsen or any new symptoms appear and provided on-call telephone numbers. IF THE PATIENT HAS ANY SUICIDAL OR HOMICIDAL IDEATIONS, CALL THE OFFICE, DISCUSS WITH A SUPPORT MEMBER, OR GO TO THE ER IMMEDIATELY. Patient was agreeable with this plan.. Follow-up in 1months        Relevant Orders   CBC with Differential/Platelet   Comprehensive metabolic panel   Hemoglobin A1c   Lipid panel   TSH   T4, free   Depression, major, single episode, in partial remission (HCC)    chronic. Depression screen reviewed medication list reviewed. Alarm symptoms absent today. Recommend continue current medications. Instructed patient to contact office or on-call physician promptly should condition worsen or any new symptoms appear and provided on-call telephone numbers. IF THE PATIENT HAS ANY SUICIDAL OR HOMICIDAL IDEATIONS, CALL THE OFFICE, DISCUSS WITH A SUPPORT MEMBER, OR GO TO THE ER IMMEDIATELY. Patient was agreeable with this plan. Follow up: 6 months.        Relevant Orders   CBC with Differential/Platelet   Comprehensive metabolic panel   Hemoglobin A1c   Lipid panel   TSH   T4, free   BMI 45.0-49.9, adult (HCC)    Chronic. 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 Recommend physical activity goal of at least 150 minutes weekly of exercise at a level that allows for increased heart rate, but not so hard you could not carry on a conversation; limit carhohydrates to 150-180 grams a day; limit saturated fat to no more than 13 grams a day; increase fiber intake to at least 15 grams a day; avoid juice, sweetened drinks, and more than 2 alcoholic beverage(s) a week; drink at least 64 ounces of water a day. Will plan to continue phentermine at this time, but will plan to change to [facility] as soon as the stock is available again for optimal management.  Labs today.  F/U in 6  weeks.        Relevant Medications   phentermine 37.5 MG capsule   Other Relevant Orders   CBC with Differential/Platelet   Comprehensive metabolic panel   Hemoglobin A1c   Lipid panel   TSH   T4, free    Return in about 6 months (around 11/25/2022) for Weight Management, HTN.   Taylor Clamp, DNP, AGNP-c 05/27/2022  8:56 AM

## 2022-05-27 NOTE — Assessment & Plan Note (Signed)
Chronic. 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 Recommend physical activity goal of at least 150 minutes weekly of exercise at a level that allows for increased heart rate, but not so hard you could not carry on a conversation; limit carhohydrates to 150-180 grams a day; limit saturated fat to no more than 13 grams a day; increase fiber intake to at least 15 grams a day; avoid juice, sweetened drinks, and more than 2 alcoholic beverage(s) a week; drink at least 64 ounces of water a day. Will plan to continue phentermine at this time, but will plan to change to [facility] as soon as the stock is available again for optimal management. Labs today.  F/U in 6  weeks.

## 2022-05-27 NOTE — Assessment & Plan Note (Signed)
chronic. Exam normal today. current treatment plan effective, no change in therapy and lab orders as documented in EMR. Currently is taking medication. Currently is not followed by endocrinology. No alarm symptoms present at this time. Recommend taking medication on an empty stomach 30 minutes to 1 hour before food or other medications for best absorption. Follow up in 6months or sooner if needed based on lab results and symptoms.     

## 2022-05-27 NOTE — Assessment & Plan Note (Signed)
chronic. Depression screen reviewed medication list reviewed. Alarm symptoms absent today. Recommend continue current medications. Instructed patient to contact office or on-call physician promptly should condition worsen or any new symptoms appear and provided on-call telephone numbers. IF THE PATIENT HAS ANY SUICIDAL OR HOMICIDAL IDEATIONS, CALL THE OFFICE, DISCUSS WITH A SUPPORT MEMBER, OR GO TO THE ER IMMEDIATELY. Patient was agreeable with this plan. Follow up: 6 months.

## 2022-05-27 NOTE — Assessment & Plan Note (Signed)
chronic. Anxiety screening reviewed medication list reviewed. Alarm symptoms absent today. Recommend continue current medications and - take medicine as prescribed for ongoing management. Instructed patient to contact office or on-call physician promptly should condition worsen or any new symptoms appear and provided on-call telephone numbers. IF THE PATIENT HAS ANY SUICIDAL OR HOMICIDAL IDEATIONS, CALL THE OFFICE, DISCUSS WITH A SUPPORT MEMBER, OR GO TO THE ER IMMEDIATELY. Patient was agreeable with this plan.. Follow-up in 21months

## 2022-05-27 NOTE — Assessment & Plan Note (Signed)
Chronic.  No alarm symptoms present at this time.  Goal blood pressure less than less than 130/80.  Refills  are not needed  today.  Labs today to check electrolytes and kidney function.  Recommend current treatment plan is effective, no change in therapy, orders and follow up as documented in EpicCare, reviewed diet, exercise and weight control, labs ordered and recent results reviewed with patient. Currently is not followed with cardiology. We will make changes to plan of care as necessary based on lab results. Plan to follow-up in 30months.

## 2022-05-28 LAB — LIPID PANEL
Chol/HDL Ratio: 4 ratio (ref 0.0–4.4)
Cholesterol, Total: 189 mg/dL (ref 100–199)
HDL: 47 mg/dL (ref >39)
LDL Chol Calc (NIH): 102 mg/dL — ABNORMAL HIGH (ref 0–99)
Triglycerides: 234 mg/dL — ABNORMAL HIGH (ref 0–149)
VLDL Cholesterol Cal: 40 mg/dL (ref 5–40)

## 2022-05-28 LAB — COMPREHENSIVE METABOLIC PANEL
ALT: 18 IU/L (ref 0–32)
AST: 18 IU/L (ref 0–40)
Albumin/Globulin Ratio: 1.7 (ref 1.2–2.2)
Albumin: 4.4 g/dL (ref 3.9–4.9)
Alkaline Phosphatase: 101 IU/L (ref 44–121)
BUN/Creatinine Ratio: 13 (ref 9–23)
BUN: 12 mg/dL (ref 6–24)
Bilirubin Total: 0.4 mg/dL (ref 0.0–1.2)
CO2: 27 mmol/L (ref 20–29)
Calcium: 9.7 mg/dL (ref 8.7–10.2)
Chloride: 100 mmol/L (ref 96–106)
Creatinine, Ser: 0.93 mg/dL (ref 0.57–1.00)
Globulin, Total: 2.6 g/dL (ref 1.5–4.5)
Glucose: 85 mg/dL (ref 70–99)
Potassium: 4.2 mmol/L (ref 3.5–5.2)
Sodium: 141 mmol/L (ref 134–144)
Total Protein: 7 g/dL (ref 6.0–8.5)
eGFR: 76 mL/min/{1.73_m2} (ref >59)

## 2022-05-28 LAB — CBC WITH DIFFERENTIAL/PLATELET
Basophils Absolute: 0 10*3/uL (ref 0.0–0.2)
Basos: 1 %
EOS (ABSOLUTE): 0.1 10*3/uL (ref 0.0–0.4)
Eos: 1 %
Hematocrit: 43.6 % (ref 34.0–46.6)
Hemoglobin: 14.4 g/dL (ref 11.1–15.9)
Immature Grans (Abs): 0 10*3/uL (ref 0.0–0.1)
Immature Granulocytes: 0 %
Lymphocytes Absolute: 2.1 10*3/uL (ref 0.7–3.1)
Lymphs: 24 %
MCH: 27.7 pg (ref 26.6–33.0)
MCHC: 33 g/dL (ref 31.5–35.7)
MCV: 84 fL (ref 79–97)
Monocytes Absolute: 0.6 10*3/uL (ref 0.1–0.9)
Monocytes: 7 %
Neutrophils Absolute: 5.7 10*3/uL (ref 1.4–7.0)
Neutrophils: 67 %
Platelets: 369 10*3/uL (ref 150–450)
RBC: 5.2 x10E6/uL (ref 3.77–5.28)
RDW: 13.1 % (ref 11.7–15.4)
WBC: 8.6 10*3/uL (ref 3.4–10.8)

## 2022-05-28 LAB — T4, FREE: Free T4: 1.48 ng/dL (ref 0.82–1.77)

## 2022-05-28 LAB — TSH: TSH: 1.81 u[IU]/mL (ref 0.450–4.500)

## 2022-05-28 LAB — HEMOGLOBIN A1C
Est. average glucose Bld gHb Est-mCnc: 126 mg/dL
Hgb A1c MFr Bld: 6 % — ABNORMAL HIGH (ref 4.8–5.6)

## 2022-05-31 ENCOUNTER — Ambulatory Visit (HOSPITAL_BASED_OUTPATIENT_CLINIC_OR_DEPARTMENT_OTHER): Payer: No Typology Code available for payment source | Admitting: Nurse Practitioner

## 2022-06-02 ENCOUNTER — Encounter: Payer: No Typology Code available for payment source | Admitting: Radiology

## 2022-06-02 ENCOUNTER — Encounter: Payer: No Typology Code available for payment source | Admitting: Nurse Practitioner

## 2022-06-08 ENCOUNTER — Ambulatory Visit: Payer: No Typology Code available for payment source | Admitting: Family Medicine

## 2022-06-11 ENCOUNTER — Other Ambulatory Visit (HOSPITAL_BASED_OUTPATIENT_CLINIC_OR_DEPARTMENT_OTHER): Payer: Self-pay

## 2022-06-18 ENCOUNTER — Ambulatory Visit (INDEPENDENT_AMBULATORY_CARE_PROVIDER_SITE_OTHER): Payer: No Typology Code available for payment source | Admitting: Radiology

## 2022-06-18 ENCOUNTER — Other Ambulatory Visit (HOSPITAL_BASED_OUTPATIENT_CLINIC_OR_DEPARTMENT_OTHER): Payer: Self-pay

## 2022-06-18 ENCOUNTER — Encounter: Payer: Self-pay | Admitting: Radiology

## 2022-06-18 VITALS — BP 128/94 | Ht 67.5 in | Wt 317.0 lb

## 2022-06-18 DIAGNOSIS — R030 Elevated blood-pressure reading, without diagnosis of hypertension: Secondary | ICD-10-CM | POA: Diagnosis not present

## 2022-06-18 DIAGNOSIS — N926 Irregular menstruation, unspecified: Secondary | ICD-10-CM | POA: Diagnosis not present

## 2022-06-18 DIAGNOSIS — Z01419 Encounter for gynecological examination (general) (routine) without abnormal findings: Secondary | ICD-10-CM

## 2022-06-18 DIAGNOSIS — K130 Diseases of lips: Secondary | ICD-10-CM

## 2022-06-18 MED ORDER — CLOTRIMAZOLE-BETAMETHASONE 1-0.05 % EX CREA
1.0000 | TOPICAL_CREAM | Freq: Two times a day (BID) | CUTANEOUS | 1 refills | Status: DC
  Filled 2022-06-18: qty 15, 7d supply, fill #0
  Filled 2022-06-18: qty 15, 8d supply, fill #0
  Filled 2022-07-30: qty 30, 15d supply, fill #1

## 2022-06-18 MED ORDER — MEDROXYPROGESTERONE ACETATE 10 MG PO TABS
10.0000 mg | ORAL_TABLET | Freq: Every day | ORAL | 3 refills | Status: DC
  Filled 2022-06-18: qty 10, 10d supply, fill #0
  Filled 2022-07-30: qty 10, 10d supply, fill #1
  Filled 2022-09-07: qty 10, 10d supply, fill #2
  Filled 2022-10-11: qty 10, 10d supply, fill #3

## 2022-06-18 NOTE — Progress Notes (Signed)
Taylor Solis [birth year] [id number]   History:  46 y.o. G3P2 presents for annual exam. Irregular periods x 2 years, was on provera from last gyn. Needs refill.  Gynecologic History Patient's last menstrual period was 03/01/2022 (exact date). Period Pattern: Regular (skipping menses x's 2 years) Contraception/Family planning: none Sexually active: yes Last Pap: 2021. Results were: normal Last mammogram: 12/18/21. Results were: normal  Obstetric History OB History  Gravida Para Term Preterm AB Living  3 2       2   SAB IAB Ectopic Multiple Live Births               # Outcome Date GA Lbr Len/2nd Weight Sex Delivery Anes PTL Lv  3 Gravida           2 Para           1 Para              The following portions of the patient's history were reviewed and updated as appropriate: allergies, current medications, past family history, past medical history, past social history, past surgical history, and problem list.  Review of Systems Pertinent items noted in HPI and remainder of comprehensive ROS otherwise negative.   Past medical history, past surgical history, family history and social history were all reviewed and documented in the EPIC chart.   Exam:  Vitals:   06/18/22 1544 06/18/22 1552  BP: (!) 132/90 (!) 128/94  Weight: (!) 317 lb (143.8 kg)   Height: 5' 7.5" (1.715 m)    Body mass index is 48.92 kg/m.  General appearance:  Normal,obese Thyroid:  Symmetrical, normal in size, without palpable masses or nodularity. Respiratory  Auscultation:  Clear without wheezing or rhonchi Cardiovascular  Auscultation:  Regular rate, without rubs, murmurs or gallops  Edema/varicosities:  Not grossly evident Abdominal  Soft,nontender, without masses, guarding or rebound.  Liver/spleen:  No organomegaly noted  Hernia:  None appreciated  Skin  Inspection:  Grossly normal Breasts: Examined lying and sitting.   Right: Without masses, retractions, nipple discharge or axillary  adenopathy.   Left: Without masses, retractions, nipple discharge or axillary adenopathy. Genitourinary   Inguinal/mons:  Normal without inguinal adenopathy  External genitalia:  Normal appearing vulva with no masses, tenderness, or lesions. Cherry angioma right labia majora  BUS/Urethra/Skene's glands:  Normal without masses or exudate  Vagina:  Normal appearing with normal color and discharge, no lesions  Cervix:  Normal appearing without discharge or lesions  Uterus:  Normal in size, shape and contour.  Mobile, nontender  Adnexa/parametria:     Rt: Normal in size, without masses or tenderness.   Lt: Normal in size, without masses or tenderness.  Anus and perineum: Normal   Patient informed chaperone available to be present for breast and pelvic exam. Patient has requested no chaperone to be present. Patient has been advised what will be completed during breast and pelvic exam.   Assessment/Plan:   1. Well woman exam with routine gynecological exam Pap due 2026  2. Irregular menses Take every 3 months if no period - medroxyPROGESTERone (PROVERA) 10 MG tablet; Take 1 tablet (10 mg total) by mouth daily.  Dispense: 10 tablet; Refill: 3  3. Intertrigo labialis Lotrisone rx sent  4. Elevated blood pressure reading F/u with PCP- recommend stopping phentermine     Discussed SBE, colonoscopy and DEXA screening as directed/appropriate. Recommend 2027 of exercise weekly, including weight bearing exercise. Encouraged the use of seatbelts and sunscreen. Return in 1 year for  annual or as needed.   Arlie Solomons B WHNP-BC 4:07 PM 06/18/2022

## 2022-06-26 ENCOUNTER — Other Ambulatory Visit (HOSPITAL_BASED_OUTPATIENT_CLINIC_OR_DEPARTMENT_OTHER): Payer: Self-pay

## 2022-07-30 ENCOUNTER — Other Ambulatory Visit (HOSPITAL_BASED_OUTPATIENT_CLINIC_OR_DEPARTMENT_OTHER): Payer: Self-pay

## 2022-07-30 ENCOUNTER — Other Ambulatory Visit: Payer: Self-pay

## 2022-08-16 ENCOUNTER — Other Ambulatory Visit (HOSPITAL_BASED_OUTPATIENT_CLINIC_OR_DEPARTMENT_OTHER): Payer: Self-pay

## 2022-08-16 ENCOUNTER — Ambulatory Visit (INDEPENDENT_AMBULATORY_CARE_PROVIDER_SITE_OTHER): Payer: 59 | Admitting: Family Medicine

## 2022-08-16 ENCOUNTER — Encounter: Payer: Self-pay | Admitting: Family Medicine

## 2022-08-16 VITALS — BP 122/80 | HR 75 | Temp 98.6°F | Ht 70.0 in | Wt 324.0 lb

## 2022-08-16 DIAGNOSIS — K219 Gastro-esophageal reflux disease without esophagitis: Secondary | ICD-10-CM | POA: Diagnosis not present

## 2022-08-16 DIAGNOSIS — R194 Change in bowel habit: Secondary | ICD-10-CM | POA: Diagnosis not present

## 2022-08-16 LAB — CBC WITH DIFFERENTIAL/PLATELET
Basophils Absolute: 0.1 10*3/uL (ref 0.0–0.1)
Basophils Relative: 0.7 % (ref 0.0–3.0)
Eosinophils Absolute: 0.1 10*3/uL (ref 0.0–0.7)
Eosinophils Relative: 1.7 % (ref 0.0–5.0)
HCT: 37.7 % (ref 36.0–46.0)
Hemoglobin: 12.7 g/dL (ref 12.0–15.0)
Lymphocytes Relative: 24.5 % (ref 12.0–46.0)
Lymphs Abs: 2 10*3/uL (ref 0.7–4.0)
MCHC: 33.6 g/dL (ref 30.0–36.0)
MCV: 82.8 fl (ref 78.0–100.0)
Monocytes Absolute: 0.7 10*3/uL (ref 0.1–1.0)
Monocytes Relative: 8.2 % (ref 3.0–12.0)
Neutro Abs: 5.3 10*3/uL (ref 1.4–7.7)
Neutrophils Relative %: 64.9 % (ref 43.0–77.0)
Platelets: 309 10*3/uL (ref 150.0–400.0)
RBC: 4.56 Mil/uL (ref 3.87–5.11)
RDW: 14.4 % (ref 11.5–15.5)
WBC: 8.1 10*3/uL (ref 4.0–10.5)

## 2022-08-16 LAB — COMPREHENSIVE METABOLIC PANEL
ALT: 30 U/L (ref 0–35)
AST: 23 U/L (ref 0–37)
Albumin: 3.9 g/dL (ref 3.5–5.2)
Alkaline Phosphatase: 69 U/L (ref 39–117)
BUN: 14 mg/dL (ref 6–23)
CO2: 30 mEq/L (ref 19–32)
Calcium: 8.8 mg/dL (ref 8.4–10.5)
Chloride: 102 mEq/L (ref 96–112)
Creatinine, Ser: 0.8 mg/dL (ref 0.40–1.20)
GFR: 86.92 mL/min (ref >60.00)
Glucose, Bld: 81 mg/dL (ref 70–99)
Potassium: 3.8 mEq/L (ref 3.5–5.1)
Sodium: 141 mEq/L (ref 135–145)
Total Bilirubin: 0.4 mg/dL (ref 0.2–1.2)
Total Protein: 6.4 g/dL (ref 6.0–8.3)

## 2022-08-16 LAB — H. PYLORI ANTIBODY, IGG: H Pylori IgG: NEGATIVE

## 2022-08-16 MED ORDER — BENEFIBER PO POWD
1.0000 | Freq: Every day | ORAL | 2 refills | Status: DC
  Filled 2022-08-16: qty 28, fill #0
  Filled 2022-09-07: qty 152, 30d supply, fill #0
  Filled 2022-10-11: qty 152, 30d supply, fill #1
  Filled 2022-11-27 – 2022-12-25 (×2): qty 152, 30d supply, fill #2

## 2022-08-16 MED ORDER — SIMETHICONE 125 MG PO CHEW
CHEWABLE_TABLET | Freq: Four times a day (QID) | ORAL | 1 refills | Status: DC | PRN
  Filled 2022-08-16: qty 100, 25d supply, fill #0
  Filled 2022-09-07: qty 60, 15d supply, fill #0
  Filled 2022-11-27: qty 60, 15d supply, fill #1

## 2022-08-16 MED ORDER — OMEPRAZOLE 40 MG PO CPDR
40.0000 mg | DELAYED_RELEASE_CAPSULE | Freq: Two times a day (BID) | ORAL | 3 refills | Status: DC
  Filled 2022-08-16 – 2022-09-07 (×2): qty 60, 30d supply, fill #0
  Filled 2022-10-11: qty 60, 30d supply, fill #1
  Filled 2022-11-19: qty 60, 30d supply, fill #2
  Filled 2022-11-27 – 2022-12-25 (×2): qty 60, 30d supply, fill #3

## 2022-08-16 NOTE — Patient Instructions (Signed)
It was very nice to see you today!  Take omeprazole 40mg  twice daily, benefiber daily, gas-x before meals.   Referring to GI for GERD but more for bowel issues.     PLEASE NOTE:  If you had any lab tests please let us know if you have not heard back within a few days. You may see your results on MyChart before we have a chance to review them but we will give you a call once they are reviewed by [place]. If we ordered any referrals today, please let us know if you have not heard from their office within the next week.   Please try these tips to maintain a healthy lifestyle:  Eat most of your calories during the day when you are active. Eliminate processed foods including packaged sweets (pies, cakes, cookies), reduce intake of potatoes, white bread, white pasta, and white rice. Look for whole grain options, oat flour or almond flour.  Each meal should contain half fruits/vegetables, one quarter protein, and one quarter carbs (no bigger than a computer mouse).  Cut down on sweet beverages. This includes juice, soda, and sweet tea. Also watch fruit intake, though this is a healthier sweet option, it still contains natural sugar! Limit to 3 servings daily.  Drink at least 1 glass of water with each meal and aim for at least 8 glasses per day  Exercise at least 150 minutes every week.

## 2022-08-16 NOTE — Progress Notes (Signed)
Subjective:     Patient ID: Taylor Solis, female    DOB: April 21, 1976, 47 y.o.   MRN: [id number]  Chief Complaint  Patient presents with   GI Problem    Having a lot of acid reflux x 2 weeks, diarrhea Discuss referral to GI    HPI  A lot of reflux past 2 wks. No dysphagia. No dark stools. Bm irreg-alt diarrhea more than constipation.  Heartburn causing pain in chest as well.  A lot of gas. No new meds.  Has cut down coffee and dairy.  Limited red meats. Taking Provera for menses. Period due(weighed 315.6 last wk).  Heartburn can be mild to start or more severe.  Last pm, bad.  Taking otc omeprazole-helps some.  No vomiting but some dry heaves last wk.   Cologard recently neg.  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  Pt not sure what.  So when constipated, really hard to push out.  Drinks plenty of water.   No abd pain x gas  Health Maintenance Due  Topic Date Due   COVID-19 Vaccine (1) Never done   PAP SMEAR-Modifier  Never done   COLONOSCOPY (Pts 45-25yrs Insurance coverage will need to be confirmed)  Never done    Past Medical History:  Diagnosis Date   Anxiety    Depression    Hypertension    Thyroid disease     Past Surgical History:  Procedure Laterality Date   CESAREAN SECTION     8416,6063   CHOLECYSTECTOMY     2017    Outpatient Medications Prior to Visit  Medication Sig Dispense Refill   Brimonidine Tartrate 0.33 % GEL Apply to face every 12 hours for Rosacea. 30 g 11   busPIRone (BUSPAR) 10 MG tablet Take 1 tablet (10 mg total) by mouth daily. 90 tablet 3   clotrimazole-betamethasone (LOTRISONE) cream Apply 1 Application topically 2 (two) times daily. 30 g 1   escitalopram (LEXAPRO) 20 MG tablet Take 1 tablet (20 mg total) by mouth daily. 90 tablet 3   hydrochlorothiazide (HYDRODIURIL) 12.5 MG tablet Take 1 tablet (12.5 mg total) by mouth daily. 90 tablet 3   levothyroxine (SYNTHROID) 75 MCG tablet Take 1 tablet (75 mcg  total) by mouth daily. 90 tablet 3   lisinopril (ZESTRIL) 10 MG tablet Take 1 tablet (10 mg total) by mouth daily. 90 tablet 3   medroxyPROGESTERone (PROVERA) 10 MG tablet Take 1 tablet (10 mg total) by mouth daily. 10 tablet 3   metroNIDAZOLE (METROGEL) 0.75 % gel Apply 1 application. topically 2 (two) times daily. To face for rosacea. 45 g 11   phentermine 37.5 MG capsule Take 1 capsule (37.5 mg total) by mouth daily. 30 capsule 5   VITAMIN D PO Take by mouth. 1000mg  once a week     cyclobenzaprine (FLEXERIL) 10 MG tablet Take 1 tablet (10 mg total) by mouth at bedtime. (Patient not taking: Reported on 06/18/2022) 30 tablet 1   Vitamin D, Ergocalciferol, (DRISDOL) 1.25 MG (50000 UNIT) CAPS capsule Take 1 capsule (50,000 Units total) by mouth every 7 (seven) days. Take for 12 total doses(weeks) than can transition to 1000 units OTC supplement daily (Patient not taking: Reported on 06/18/2022) 12 capsule 0   No facility-administered medications prior to visit.    No Known Allergies ROS neg/noncontributory except as noted HPI/below      Objective:     BP 122/80   Pulse 75   Temp 98.6  F (37 C) (Temporal)   Ht 5\' 10"  (1.778 m)   Wt (!) 324 lb (147 kg)   SpO2 99%   BMI 46.49 kg/m  Wt Readings from Last 3 Encounters:  08/16/22 (!) 324 lb (147 kg)  06/18/22 (!) 317 lb (143.8 kg)  05/27/22 (!) 317 lb (143.8 kg)    Physical Exam   Gen: WDWN NAD HEENT: NCAT, conjunctiva not injected, sclera nonicteric NECK:  supple, no thyromegaly, no nodes, no carotid bruits CARDIAC: RRR, S1S2+, no murmur.  LUNGS: CTAB. No wheezes ABDOMEN:  BS+, soft, NTND, No HSM, no masses EXT:  no edema MSK: no gross abnormalities.  NEURO: A&O x3.  CN II-XII intact.  PSYCH: normal mood. Good eye contact     Assessment & Plan:   Problem List Items Addressed This Visit   None Visit Diagnoses     Gastroesophageal reflux disease without esophagitis    -  Primary   Change in bowel habit           GERD-new.  Working on [employer].  Check h pylori, cbc,cmp.  Omeprazole 40mg  bid.  Gas-x, benefiber daily.  Refer to GI more d/t bowels. Change in bowel habits-?d/t GERD, h pylori, diet, etc.  See above.  Refer GI.  Wt gain may be d/t menses due.   No orders of the defined types were placed in this encounter.   Wellington Hampshire, MD

## 2022-08-25 ENCOUNTER — Other Ambulatory Visit (HOSPITAL_BASED_OUTPATIENT_CLINIC_OR_DEPARTMENT_OTHER): Payer: Self-pay

## 2022-09-07 ENCOUNTER — Other Ambulatory Visit: Payer: Self-pay

## 2022-09-07 ENCOUNTER — Other Ambulatory Visit (HOSPITAL_BASED_OUTPATIENT_CLINIC_OR_DEPARTMENT_OTHER): Payer: Self-pay

## 2022-09-08 ENCOUNTER — Other Ambulatory Visit: Payer: Self-pay

## 2022-10-11 ENCOUNTER — Other Ambulatory Visit: Payer: Self-pay

## 2022-10-11 ENCOUNTER — Other Ambulatory Visit (HOSPITAL_BASED_OUTPATIENT_CLINIC_OR_DEPARTMENT_OTHER): Payer: Self-pay

## 2022-11-19 ENCOUNTER — Other Ambulatory Visit: Payer: Self-pay

## 2022-11-19 ENCOUNTER — Other Ambulatory Visit: Payer: Self-pay | Admitting: Radiology

## 2022-11-19 ENCOUNTER — Other Ambulatory Visit (HOSPITAL_BASED_OUTPATIENT_CLINIC_OR_DEPARTMENT_OTHER): Payer: Self-pay

## 2022-11-19 DIAGNOSIS — N926 Irregular menstruation, unspecified: Secondary | ICD-10-CM

## 2022-11-19 NOTE — Telephone Encounter (Signed)
Medication refill request: provera 10mg  Last AEX:  06-18-22 Next AEX: not scheduled Last MMG (if hormonal medication request): 12-18-21 neg Refill authorized: please approve or deny as appropriate

## 2022-11-23 ENCOUNTER — Other Ambulatory Visit (HOSPITAL_BASED_OUTPATIENT_CLINIC_OR_DEPARTMENT_OTHER): Payer: Self-pay

## 2022-11-23 MED ORDER — MEDROXYPROGESTERONE ACETATE 10 MG PO TABS
10.0000 mg | ORAL_TABLET | Freq: Every day | ORAL | 0 refills | Status: DC
  Filled 2022-11-23: qty 10, 10d supply, fill #0

## 2022-11-27 ENCOUNTER — Other Ambulatory Visit (HOSPITAL_BASED_OUTPATIENT_CLINIC_OR_DEPARTMENT_OTHER): Payer: Self-pay

## 2022-12-22 ENCOUNTER — Other Ambulatory Visit (HOSPITAL_BASED_OUTPATIENT_CLINIC_OR_DEPARTMENT_OTHER): Payer: Self-pay

## 2022-12-22 DIAGNOSIS — Z1231 Encounter for screening mammogram for malignant neoplasm of breast: Secondary | ICD-10-CM

## 2022-12-25 ENCOUNTER — Other Ambulatory Visit (HOSPITAL_BASED_OUTPATIENT_CLINIC_OR_DEPARTMENT_OTHER): Payer: Self-pay

## 2022-12-25 ENCOUNTER — Other Ambulatory Visit: Payer: Self-pay | Admitting: Nurse Practitioner

## 2022-12-25 ENCOUNTER — Other Ambulatory Visit (HOSPITAL_BASED_OUTPATIENT_CLINIC_OR_DEPARTMENT_OTHER): Payer: Self-pay | Admitting: Nurse Practitioner

## 2022-12-25 ENCOUNTER — Inpatient Hospital Stay (HOSPITAL_BASED_OUTPATIENT_CLINIC_OR_DEPARTMENT_OTHER): Admission: RE | Admit: 2022-12-25 | Payer: 59 | Source: Ambulatory Visit | Admitting: Radiology

## 2022-12-25 DIAGNOSIS — Z1231 Encounter for screening mammogram for malignant neoplasm of breast: Secondary | ICD-10-CM

## 2022-12-25 DIAGNOSIS — Z6841 Body Mass Index (BMI) 40.0 and over, adult: Secondary | ICD-10-CM

## 2022-12-25 DIAGNOSIS — N926 Irregular menstruation, unspecified: Secondary | ICD-10-CM

## 2022-12-27 ENCOUNTER — Other Ambulatory Visit: Payer: Self-pay

## 2022-12-27 ENCOUNTER — Other Ambulatory Visit (HOSPITAL_BASED_OUTPATIENT_CLINIC_OR_DEPARTMENT_OTHER): Payer: Self-pay

## 2022-12-27 MED ORDER — PHENTERMINE HCL 37.5 MG PO CAPS
37.5000 mg | ORAL_CAPSULE | Freq: Every day | ORAL | 0 refills | Status: DC
  Filled 2022-12-27: qty 30, 30d supply, fill #0

## 2022-12-27 MED ORDER — ESCITALOPRAM OXALATE 20 MG PO TABS
20.0000 mg | ORAL_TABLET | Freq: Every day | ORAL | 0 refills | Status: DC
  Filled 2022-12-27: qty 90, 90d supply, fill #0

## 2022-12-27 NOTE — Telephone Encounter (Signed)
Medication refill request: provera  Last AEX:  06/18/22 Next AEX: none scheduled  Last MMG (if hormonal medication request): n/a Refill authorized: please advise

## 2022-12-28 ENCOUNTER — Other Ambulatory Visit: Payer: Self-pay

## 2022-12-28 ENCOUNTER — Other Ambulatory Visit (HOSPITAL_BASED_OUTPATIENT_CLINIC_OR_DEPARTMENT_OTHER): Payer: Self-pay

## 2022-12-28 MED ORDER — MEDROXYPROGESTERONE ACETATE 10 MG PO TABS
10.0000 mg | ORAL_TABLET | Freq: Every day | ORAL | 0 refills | Status: DC
Start: 2022-12-28 — End: 2023-01-27
  Filled 2022-12-28: qty 10, 10d supply, fill #0

## 2022-12-30 ENCOUNTER — Other Ambulatory Visit (HOSPITAL_BASED_OUTPATIENT_CLINIC_OR_DEPARTMENT_OTHER): Payer: Self-pay

## 2023-01-01 ENCOUNTER — Other Ambulatory Visit (HOSPITAL_BASED_OUTPATIENT_CLINIC_OR_DEPARTMENT_OTHER): Payer: Self-pay

## 2023-01-01 ENCOUNTER — Ambulatory Visit (HOSPITAL_BASED_OUTPATIENT_CLINIC_OR_DEPARTMENT_OTHER)
Admission: RE | Admit: 2023-01-01 | Discharge: 2023-01-01 | Disposition: A | Discharge status: Home / Self Care | Payer: 59 | Source: Ambulatory Visit | Attending: Family Medicine | Admitting: Family Medicine

## 2023-01-01 DIAGNOSIS — Z1231 Encounter for screening mammogram for malignant neoplasm of breast: Secondary | ICD-10-CM | POA: Insufficient documentation

## 2023-01-21 ENCOUNTER — Other Ambulatory Visit (HOSPITAL_BASED_OUTPATIENT_CLINIC_OR_DEPARTMENT_OTHER): Payer: Self-pay

## 2023-01-21 ENCOUNTER — Encounter: Payer: Self-pay | Admitting: Family Medicine

## 2023-01-21 ENCOUNTER — Ambulatory Visit (INDEPENDENT_AMBULATORY_CARE_PROVIDER_SITE_OTHER): Payer: 59 | Admitting: Family Medicine

## 2023-01-21 VITALS — BP 111/78 | HR 81 | Temp 97.9°F | Resp 18 | Ht 70.0 in | Wt 318.1 lb

## 2023-01-21 DIAGNOSIS — N3000 Acute cystitis without hematuria: Secondary | ICD-10-CM | POA: Diagnosis not present

## 2023-01-21 DIAGNOSIS — R8290 Unspecified abnormal findings in urine: Secondary | ICD-10-CM

## 2023-01-21 LAB — POC URINALSYSI DIPSTICK (AUTOMATED)
Bilirubin, UA: NEGATIVE
Blood, UA: NEGATIVE
Glucose, UA: NEGATIVE
Ketones, UA: NEGATIVE
Nitrite, UA: POSITIVE
Protein, UA: NEGATIVE
Spec Grav, UA: >=1.03 — AB (ref 1.010–1.025)
Urobilinogen, UA: 0.2 E.U./dL
pH, UA: 5.5 (ref 5.0–8.0)

## 2023-01-21 MED ORDER — CEPHALEXIN 500 MG PO CAPS
500.0000 mg | ORAL_CAPSULE | Freq: Two times a day (BID) | ORAL | 0 refills | Status: AC
  Filled 2023-01-21: qty 14, 7d supply, fill #0

## 2023-01-21 NOTE — Progress Notes (Signed)
Subjective:     Patient ID: Taylor Solis, female    DOB: [birth year], 47 y.o.   MRN: [id number]  Chief Complaint  Patient presents with   Discolored Urine   Urine Odor    Sx started a little over 2 weeks ago, took AZO and cranberry Feels like can't finish when urinating    HPI UTI - Complains of urinary pressure, odor, color change for the last 4-5 days. Was taking azo, was helping. Stopped 3 days ago and sx are back. No dysuria, some frequency. Denies fever/chills/back pain.  Rash on chest, resolving.   Health Maintenance Due  Topic Date Due   PAP SMEAR-Modifier  Never done   Colonoscopy  Never done    Past Medical History:  Diagnosis Date   Anxiety    Depression    Hypertension    Thyroid disease     Past Surgical History:  Procedure Laterality Date   CESAREAN SECTION     [id number]   CHOLECYSTECTOMY     2017     Current Outpatient Medications:    busPIRone (BUSPAR) 10 MG tablet, Take 1 tablet (10 mg total) by mouth daily., Disp: 90 tablet, Rfl: 3   cephALEXin (KEFLEX) 500 MG capsule, Take 1 capsule (500 mg total) by mouth 2 (two) times daily for 7 days., Disp: 14 capsule, Rfl: 0   escitalopram (LEXAPRO) 20 MG tablet, Take 1 tablet (20 mg total) by mouth daily., Disp: 90 tablet, Rfl: 0   hydrochlorothiazide (HYDRODIURIL) 12.5 MG tablet, Take 1 tablet (12.5 mg total) by mouth daily., Disp: 90 tablet, Rfl: 3   levothyroxine (SYNTHROID) 75 MCG tablet, Take 1 tablet (75 mcg total) by mouth daily., Disp: 90 tablet, Rfl: 3   lisinopril (ZESTRIL) 10 MG tablet, Take 1 tablet (10 mg total) by mouth daily., Disp: 90 tablet, Rfl: 3   medroxyPROGESTERone (PROVERA) 10 MG tablet, Take 1 tablet (10 mg total) by mouth daily., Disp: 10 tablet, Rfl: 0   omeprazole (PRILOSEC) 40 MG capsule, Take 1 capsule (40 mg total) by mouth in the morning and at bedtime., Disp: 60 capsule, Rfl: 3   phentermine 37.5 MG capsule, Take 1 capsule (37.5 mg total) by mouth daily., Disp: 30  capsule, Rfl: 0   simethicone (MYLICON) 125 MG chewable tablet, Chew by mouth every 6 (six) hours as needed., Disp: 60 tablet, Rfl: 1   VITAMIN D PO, Take by mouth. 5000 mg once a week, Disp: , Rfl:    Wheat Dextrin (BENEFIBER) POWD, take once by mouth daily in the afternoon., Disp: 152 g, Rfl: 2   clotrimazole-betamethasone (LOTRISONE) cream, Apply 1 Application topically 2 (two) times daily. (Patient not taking: Reported on 01/21/2023), Disp: 30 g, Rfl: 1  No Known Allergies ROS neg/noncontributory except as noted HPI/below      Objective:     BP 111/78   Pulse 81   Temp 97.9 F (36.6 C) (Temporal)   Resp 18   Ht 5\' 10"  (1.778 m)   Wt (!) 318 lb 2 oz (144.3 kg)   SpO2 100%   BMI 45.65 kg/m  Wt Readings from Last 3 Encounters:  01/21/23 (!) 318 lb 2 oz (144.3 kg)  08/16/22 (!) 324 lb (147 kg)  06/18/22 (!) 317 lb (143.8 kg)    Physical Exam  Gen: WDWN NAD HEENT: NCAT, conjunctiva not injected, sclera nonicteric ABDOMEN:  BS+, soft, NTND, No HSM, no masses, no CVAT.  EXT:  no edema MSK: no gross abnormalities.  NEURO: A&O  x3.  CN II-XII intact.  PSYCH: normal mood. Good eye contact  Results for orders placed or performed in visit on 01/21/23  POCT Urinalysis Dipstick (Automated)  Result Value Ref Range   Color, UA DARK YELLOW    Clarity, UA CLOUDY    Glucose, UA Negative Negative   Bilirubin, UA NEG    Ketones, UA NEG    Spec Grav, UA >=1.030 (A) 1.010 - 1.025   Blood, UA NEG    pH, UA 5.5 5.0 - 8.0   Protein, UA Negative Negative   Urobilinogen, UA 0.2 0.2 or 1.0 E.U./dL   Nitrite, UA POS    Leukocytes, UA Small (1+) (A) Negative       Assessment & Plan:  Acute cystitis without hematuria  Bad odor of urine -     POCT Urinalysis Dipstick (Automated) -     Urine Culture  Other orders -     Cephalexin; Take 1 capsule (500 mg total) by mouth 2 (two) times daily for 7 days.  Dispense: 14 capsule; Refill: 0   UTI-keflex 500mg  bid.  Check cx.  Return if  symptoms worsen or fail to improve.   I,Rachel Rivera,acting as a scribe for Angelena Sole, MD.,have documented all relevant documentation on the behalf of Angelena Sole, MD,as directed by  Angelena Sole, MD while in the presence of Angelena Sole, MD.  I, Angelena Sole, MD, have reviewed all documentation for this visit. The documentation on 01/21/23 for the exam, diagnosis, procedures, and orders are all accurate and complete.   Angelena Sole, MD

## 2023-01-21 NOTE — Patient Instructions (Signed)
It was very nice to see you today!  Will start keflex for probable UTI.     PLEASE NOTE:  If you had any lab tests please let us know if you have not heard back within a few days. You may see your results on MyChart before we have a chance to review them but we will give you a call once they are reviewed by [place]. If we ordered any referrals today, please let us know if you have not heard from their office within the next week.   Please try these tips to maintain a healthy lifestyle:  Eat most of your calories during the day when you are active. Eliminate processed foods including packaged sweets (pies, cakes, cookies), reduce intake of potatoes, white bread, white pasta, and white rice. Look for whole grain options, oat flour or almond flour.  Each meal should contain half fruits/vegetables, one quarter protein, and one quarter carbs (no bigger than a computer mouse).  Cut down on sweet beverages. This includes juice, soda, and sweet tea. Also watch fruit intake, though this is a healthier sweet option, it still contains natural sugar! Limit to 3 servings daily.  Drink at least 1 glass of water with each meal and aim for at least 8 glasses per day  Exercise at least 150 minutes every week.

## 2023-01-23 LAB — URINE CULTURE
MICRO NUMBER:: 15193656
SPECIMEN QUALITY:: ADEQUATE

## 2023-01-24 ENCOUNTER — Ambulatory Visit: Payer: 59 | Admitting: Family Medicine

## 2023-01-24 ENCOUNTER — Encounter: Payer: Self-pay | Admitting: Family Medicine

## 2023-01-24 ENCOUNTER — Other Ambulatory Visit (HOSPITAL_BASED_OUTPATIENT_CLINIC_OR_DEPARTMENT_OTHER): Payer: Self-pay

## 2023-01-24 VITALS — BP 117/83 | HR 78 | Temp 98.1°F | Resp 18 | Ht 70.0 in | Wt 315.1 lb

## 2023-01-24 DIAGNOSIS — Z6841 Body Mass Index (BMI) 40.0 and over, adult: Secondary | ICD-10-CM

## 2023-01-24 DIAGNOSIS — Z0000 Encounter for general adult medical examination without abnormal findings: Secondary | ICD-10-CM

## 2023-01-24 DIAGNOSIS — L719 Rosacea, unspecified: Secondary | ICD-10-CM | POA: Diagnosis not present

## 2023-01-24 MED ORDER — BUSPIRONE HCL 10 MG PO TABS
10.0000 mg | ORAL_TABLET | Freq: Every day | ORAL | 3 refills | Status: DC
  Filled 2023-01-24: qty 30, 30d supply, fill #0
  Filled 2023-03-17: qty 30, 30d supply, fill #1
  Filled 2023-04-16 (×2): qty 30, 30d supply, fill #2
  Filled 2023-05-23: qty 30, 30d supply, fill #3
  Filled 2023-07-07: qty 30, 30d supply, fill #4
  Filled 2023-08-24: qty 30, 30d supply, fill #5

## 2023-01-24 MED ORDER — HYDROCHLOROTHIAZIDE 12.5 MG PO TABS
12.5000 mg | ORAL_TABLET | Freq: Every day | ORAL | 3 refills | Status: DC
  Filled 2023-01-24: qty 90, 90d supply, fill #0
  Filled 2023-05-23: qty 90, 90d supply, fill #1
  Filled 2023-08-24: qty 90, 90d supply, fill #2

## 2023-01-24 MED ORDER — LEVOTHYROXINE SODIUM 75 MCG PO TABS
75.0000 ug | ORAL_TABLET | Freq: Every day | ORAL | 3 refills | Status: DC
  Filled 2023-01-24: qty 90, 90d supply, fill #0
  Filled 2023-05-23: qty 90, 90d supply, fill #1
  Filled 2023-08-24: qty 90, 90d supply, fill #2

## 2023-01-24 MED ORDER — CLINDAMYCIN PHOSPHATE 1 % EX GEL
Freq: Two times a day (BID) | CUTANEOUS | 3 refills | Status: AC
  Filled 2023-01-24: qty 60, 30d supply, fill #0
  Filled 2023-03-17: qty 60, 30d supply, fill #1
  Filled 2023-04-16 (×2): qty 60, 30d supply, fill #2

## 2023-01-24 MED ORDER — LISINOPRIL 10 MG PO TABS
10.0000 mg | ORAL_TABLET | Freq: Every day | ORAL | 3 refills | Status: DC
  Filled 2023-01-24: qty 90, 90d supply, fill #0
  Filled 2023-05-23: qty 90, 90d supply, fill #1
  Filled 2023-08-24: qty 90, 90d supply, fill #2

## 2023-01-24 MED ORDER — ESCITALOPRAM OXALATE 20 MG PO TABS
20.0000 mg | ORAL_TABLET | Freq: Every day | ORAL | 1 refills | Status: DC
  Filled 2023-01-24 – 2023-04-16 (×2): qty 90, 90d supply, fill #0

## 2023-01-24 MED ORDER — PHENTERMINE HCL 37.5 MG PO CAPS
37.5000 mg | ORAL_CAPSULE | Freq: Every day | ORAL | 2 refills | Status: DC
Start: 2023-01-24 — End: 2023-05-23
  Filled 2023-01-24: qty 30, 30d supply, fill #0
  Filled 2023-03-17: qty 30, 30d supply, fill #1
  Filled 2023-04-16 (×2): qty 30, 30d supply, fill #2

## 2023-01-24 NOTE — Patient Instructions (Signed)

## 2023-01-24 NOTE — Progress Notes (Signed)
Phone [phone]   Subjective:   Patient is a 47 y.o. female presenting for annual physical.    Chief Complaint  Patient presents with   Annual Exam    CPE    Annual   UTI - She was last seen on 7/12 for urinary pressure, odor, and color change. No other urinary sx. Was started on keflex 500 mg BID. Doing better  Rosacea - She states her rosacea has worsened. She endorses pimples and redness, occasional itchiness.face irritate.  Has tried avoiding dairy and spicy foods. Has used brimonidine and metronidazole for months, but states it did not work and [place].   Weight management - She is taking phentermine 37.5 mg, tolerating well. States phentermine has helped her. She reports she recently gained about 10 lbs due to many life stressors occurring all at once, but she is slowing losing the weight again. Not currently participating in any weight programs. No chest pain, palpitations, or SI.   See problem oriented charting- ROS- ROS: Gen: no fever, chills  Skin: no rash, itching. +Rosacea ENT: no ear pain, ear drainage, nasal congestion, rhinorrhea, sinus pressure, sore throat Eyes: no blurry vision, double vision Resp: no cough, wheeze,SOB CV: no CP, palpitations, LE edema,  GI: no heartburn, n/v/d/c, abd pain GU: no dysuria, urgency, frequency, hematuria MSK: no joint pain, myalgias, back pain Neuro: no dizziness, headache, weakness, vertigo Psych: no, SI    moods stable  The following were reviewed and entered/updated in epic: Past Medical History:  Diagnosis Date   Anxiety    Depression    Hypertension    Thyroid disease    Patient Active Problem List   Diagnosis Date Noted   BMI 45.0-49.9, adult (HCC) 12/31/2021   Rosacea, acne 11/20/2021   Piriformis syndrome, right 11/20/2021   Acquired hypothyroidism 11/20/2021   Primary hypertension 11/20/2021   Generalized anxiety disorder 11/20/2021   Depression, major, single episode, in partial remission (HCC)  11/20/2021   Past Surgical History:  Procedure Laterality Date   CESAREAN SECTION     [id number]   CHOLECYSTECTOMY     2017    Family History  Problem Relation Age of Onset   Lung cancer Mother    Hypertension Mother    Hyperlipidemia Mother    Emphysema Mother    COPD Mother    Anxiety disorder Mother    Arthritis Mother    Cancer Mother    Depression Mother    Leukemia Father    Cancer Father    Diabetes Maternal Grandmother    Heart disease Maternal Grandmother    Hypertension Maternal Grandmother    Diabetes Paternal Grandmother    Hypertension Paternal Grandmother    Asthma Sister    Depression Sister     Medications- reviewed and updated Current Outpatient Medications  Medication Sig Dispense Refill   cephALEXin (KEFLEX) 500 MG capsule Take 1 capsule (500 mg total) by mouth 2 (two) times daily for 7 days. 14 capsule 0   clindamycin (CLINDAGEL) 1 % gel Apply topically 2 (two) times daily. 60 g 3   medroxyPROGESTERone (PROVERA) 10 MG tablet Take 1 tablet (10 mg total) by mouth daily. 10 tablet 0   omeprazole (PRILOSEC) 40 MG capsule Take 1 capsule (40 mg total) by mouth in the morning and at bedtime. 60 capsule 3   simethicone (MYLICON) 125 MG chewable tablet Chew by mouth every 6 (six) hours as needed. 60 tablet 1   VITAMIN D PO Take by mouth. 5000 mg once  a week     Wheat Dextrin (BENEFIBER) POWD take once by mouth daily in the afternoon. 152 g 2   busPIRone (BUSPAR) 10 MG tablet Take 1 tablet (10 mg total) by mouth daily. 90 tablet 3   escitalopram (LEXAPRO) 20 MG tablet Take 1 tablet (20 mg total) by mouth daily. 90 tablet 1   hydrochlorothiazide (HYDRODIURIL) 12.5 MG tablet Take 1 tablet (12.5 mg total) by mouth daily. 90 tablet 3   levothyroxine (SYNTHROID) 75 MCG tablet Take 1 tablet (75 mcg total) by mouth daily. 90 tablet 3   lisinopril (ZESTRIL) 10 MG tablet Take 1 tablet (10 mg total) by mouth daily. 90 tablet 3   phentermine 37.5 MG capsule Take 1 capsule  (37.5 mg total) by mouth daily. 30 capsule 2   No current facility-administered medications for this visit.    Allergies-reviewed and updated No Known Allergies  Social History   Social History Narrative   Not on file   Objective  Objective:  BP 117/83   Pulse 78   Temp 98.1 F (36.7 C) (Temporal)   Resp 18   Ht 5\' 10"  (1.778 m)   Wt (!) 315 lb 2 oz (142.9 kg)   SpO2 98%   BMI 45.22 kg/m  Physical Exam  Gen: WDWN NAD HEENT: NCAT, conjunctiva not injected, sclera nonicteric TM WNL B, OP moist, no exudates  NECK:  supple, no thyromegaly, no nodes, no carotid bruits CARDIAC: RRR, S1S2+, no murmur. DP 2+B LUNGS: CTAB. No wheezes ABDOMEN:  BS+, soft, NTND, No HSM, no masses EXT:  no edema MSK: no gross abnormalities. MS 5/5 all 4 NEURO: A&O x3.  CN II-XII intact.  PSYCH: normal mood. Good eye contact  Face: Rosy cheeks, irritated, some pimples Arms: A lot of scarring from previous scratching    Assessment and Plan   Health Maintenance counseling: 1. Anticipatory guidance: Patient counseled regarding regular dental exams q6 months, eye exams,  avoiding smoking and second hand smoke, limiting alcohol to 1 beverage per day, no illicit drugs.   2. Risk factor reduction:  Advised patient of need for regular exercise and diet rich and fruits and vegetables to reduce risk of heart attack and stroke. Exercise- some.  Wt Readings from Last 3 Encounters:  01/24/23 (!) 315 lb 2 oz (142.9 kg)  01/21/23 (!) 318 lb 2 oz (144.3 kg)  08/16/22 (!) 324 lb (147 kg)   3. Immunizations/screenings/ancillary studies Immunization History  Administered Date(s) Administered   Tdap 06/22/2021   There are no preventive care reminders to display for this patient.   4. Cervical cancer screening- Last pap 2021. Results were normal.  5. Breast cancer screening-  last mammogram 01/01/2023. Results were normal.  6. Colon cancer screening - Cologuard done 2023.  7. Skin cancer screening- advised  regular sunscreen use. Denies worrisome, changing, or new skin lesions.  8. Birth control/STD check- N/a 9. Osteoporosis screening- N/a 10. Smoking associated screening - ever smoker  Wellness examination -     Lipid panel -     Comprehensive metabolic panel -     CBC with Differential/Platelet -     Hemoglobin A1c -     TSH  BMI 45.0-49.9, adult (HCC) -     Phentermine HCl; Take 1 capsule (37.5 mg total) by mouth daily.  Dispense: 30 capsule; Refill: 2  Other orders -     busPIRone HCl; Take 1 tablet (10 mg total) by mouth daily.  Dispense: 90 tablet; Refill: 3 -  Escitalopram Oxalate; Take 1 tablet (20 mg total) by mouth daily.  Dispense: 90 tablet; Refill: 1 -     hydroCHLOROthiazide; Take 1 tablet (12.5 mg total) by mouth daily.  Dispense: 90 tablet; Refill: 3 -     Levothyroxine Sodium; Take 1 tablet (75 mcg total) by mouth daily.  Dispense: 90 tablet; Refill: 3 -     Lisinopril; Take 1 tablet (10 mg total) by mouth daily.  Dispense: 90 tablet; Refill: 3 -     Clindamycin Phosphate; Apply topically 2 (two) times daily.  Dispense: 60 g; Refill: 3   Wellness-anticipatory guidance.  Work on Diet/Exercise  Check CBC,CMP,lipids,TSH, A1C.  F/u 1 yr  Rosacea-trial clinda.  Consider ivermectin.  Sch w/derm Wt-working on diet/exercise.  PDMP checked.  Renewed phentermine 37.5mg  daily  Recommended follow up: Return in about 3 months (around 04/26/2023) for wt.  Lab/Order associations: +fasting   I,Rachel Rivera,acting as a scribe for Angelena Sole, MD.,have documented all relevant documentation on the behalf of Angelena Sole, MD,as directed by  Angelena Sole, MD while in the presence of Angelena Sole, MD.  I, Angelena Sole, MD, have reviewed all documentation for this visit. The documentation on 01/24/23 for the exam, diagnosis, procedures, and orders are all accurate and complete.   Angelena Sole, MD

## 2023-01-25 ENCOUNTER — Other Ambulatory Visit (HOSPITAL_BASED_OUTPATIENT_CLINIC_OR_DEPARTMENT_OTHER): Payer: Self-pay

## 2023-01-25 LAB — CBC WITH DIFFERENTIAL/PLATELET
Basophils Absolute: 0.1 10*3/uL (ref 0.0–0.1)
Basophils Relative: 1.2 % (ref 0.0–3.0)
Eosinophils Absolute: 0.1 10*3/uL (ref 0.0–0.7)
Eosinophils Relative: 0.7 % (ref 0.0–5.0)
HCT: 40.7 % (ref 36.0–46.0)
Hemoglobin: 13.3 g/dL (ref 12.0–15.0)
Lymphocytes Relative: 18.5 % (ref 12.0–46.0)
Lymphs Abs: 1.7 10*3/uL (ref 0.7–4.0)
MCHC: 32.7 g/dL (ref 30.0–36.0)
MCV: 83.6 fl (ref 78.0–100.0)
Monocytes Absolute: 0.5 10*3/uL (ref 0.1–1.0)
Monocytes Relative: 5.8 % (ref 3.0–12.0)
Neutro Abs: 6.8 10*3/uL (ref 1.4–7.7)
Neutrophils Relative %: 73.8 % (ref 43.0–77.0)
Platelets: 313 10*3/uL (ref 150.0–400.0)
RBC: 4.87 Mil/uL (ref 3.87–5.11)
RDW: 15 % (ref 11.5–15.5)
WBC: 9.2 10*3/uL (ref 4.0–10.5)

## 2023-01-25 LAB — LDL CHOLESTEROL, DIRECT: Direct LDL: 110 mg/dL

## 2023-01-25 LAB — COMPREHENSIVE METABOLIC PANEL
ALT: 27 U/L (ref 0–35)
AST: 19 U/L (ref 0–37)
Albumin: 3.9 g/dL (ref 3.5–5.2)
Alkaline Phosphatase: 71 U/L (ref 39–117)
BUN: 10 mg/dL (ref 6–23)
CO2: 30 mEq/L (ref 19–32)
Calcium: 9.2 mg/dL (ref 8.4–10.5)
Chloride: 100 mEq/L (ref 96–112)
Creatinine, Ser: 0.73 mg/dL (ref 0.40–1.20)
GFR: 96.71 mL/min (ref >60.00)
Glucose, Bld: 82 mg/dL (ref 70–99)
Potassium: 3.7 mEq/L (ref 3.5–5.1)
Sodium: 137 mEq/L (ref 135–145)
Total Bilirubin: 0.4 mg/dL (ref 0.2–1.2)
Total Protein: 6.9 g/dL (ref 6.0–8.3)

## 2023-01-25 LAB — LIPID PANEL
Cholesterol: 197 mg/dL (ref 0–200)
HDL: 45.2 mg/dL (ref >39.00)
NonHDL: 151.46
Total CHOL/HDL Ratio: 4
Triglycerides: 247 mg/dL — ABNORMAL HIGH (ref 0.0–149.0)
VLDL: 49.4 mg/dL — ABNORMAL HIGH (ref 0.0–40.0)

## 2023-01-25 LAB — TSH: TSH: 2.63 u[IU]/mL (ref 0.35–5.50)

## 2023-01-25 LAB — HEMOGLOBIN A1C: Hgb A1c MFr Bld: 5.7 % (ref 4.6–6.5)

## 2023-01-26 ENCOUNTER — Other Ambulatory Visit (HOSPITAL_BASED_OUTPATIENT_CLINIC_OR_DEPARTMENT_OTHER): Payer: Self-pay

## 2023-01-27 ENCOUNTER — Other Ambulatory Visit: Payer: Self-pay | Admitting: Radiology

## 2023-01-27 ENCOUNTER — Other Ambulatory Visit: Payer: Self-pay | Admitting: Family Medicine

## 2023-01-27 DIAGNOSIS — N926 Irregular menstruation, unspecified: Secondary | ICD-10-CM

## 2023-01-27 MED ORDER — OMEPRAZOLE 40 MG PO CPDR
40.0000 mg | DELAYED_RELEASE_CAPSULE | Freq: Two times a day (BID) | ORAL | 0 refills | Status: DC
  Filled 2023-01-27: qty 180, 90d supply, fill #0

## 2023-01-27 NOTE — Progress Notes (Signed)
Labs great.  Sugars better-keep working on it Cholesterol ok except triglycerides elevated-keep working on it!Marland Kitchen

## 2023-01-28 ENCOUNTER — Other Ambulatory Visit (HOSPITAL_BASED_OUTPATIENT_CLINIC_OR_DEPARTMENT_OTHER): Payer: Self-pay

## 2023-01-28 ENCOUNTER — Other Ambulatory Visit: Payer: Self-pay

## 2023-01-31 ENCOUNTER — Other Ambulatory Visit: Payer: Self-pay | Admitting: Radiology

## 2023-01-31 ENCOUNTER — Other Ambulatory Visit (HOSPITAL_BASED_OUTPATIENT_CLINIC_OR_DEPARTMENT_OTHER): Payer: Self-pay

## 2023-01-31 DIAGNOSIS — N926 Irregular menstruation, unspecified: Secondary | ICD-10-CM

## 2023-01-31 NOTE — Telephone Encounter (Signed)
Medication refill request: provera  Last AEX:  06/18/22 Next AEX: nothing scheduled  Last MMG (if hormonal medication request): n/a Refill authorized: please advise

## 2023-01-31 NOTE — Telephone Encounter (Signed)
Med refill request: Provera Last AEX: 06/18/22 with JC Next AEX: not scheduled Last MMG (if hormonal med) 01/01/23 Refill authorized: Please Advise, #10, 0 RF

## 2023-02-01 ENCOUNTER — Other Ambulatory Visit (HOSPITAL_BASED_OUTPATIENT_CLINIC_OR_DEPARTMENT_OTHER): Payer: Self-pay

## 2023-02-01 MED ORDER — MEDROXYPROGESTERONE ACETATE 10 MG PO TABS
10.0000 mg | ORAL_TABLET | Freq: Every day | ORAL | 0 refills | Status: DC
Start: 2023-02-01 — End: 2023-03-17
  Filled 2023-02-01: qty 10, 10d supply, fill #0

## 2023-02-05 ENCOUNTER — Other Ambulatory Visit (HOSPITAL_BASED_OUTPATIENT_CLINIC_OR_DEPARTMENT_OTHER): Payer: Self-pay

## 2023-03-17 ENCOUNTER — Other Ambulatory Visit: Payer: Self-pay | Admitting: Radiology

## 2023-03-17 DIAGNOSIS — N926 Irregular menstruation, unspecified: Secondary | ICD-10-CM

## 2023-03-18 ENCOUNTER — Other Ambulatory Visit (HOSPITAL_BASED_OUTPATIENT_CLINIC_OR_DEPARTMENT_OTHER): Payer: Self-pay

## 2023-03-18 ENCOUNTER — Other Ambulatory Visit: Payer: Self-pay

## 2023-03-18 MED ORDER — MEDROXYPROGESTERONE ACETATE 10 MG PO TABS
10.0000 mg | ORAL_TABLET | Freq: Every day | ORAL | 0 refills | Status: DC
Start: 2023-03-18 — End: 2023-09-16
  Filled 2023-03-18: qty 10, 10d supply, fill #0

## 2023-03-18 NOTE — Telephone Encounter (Signed)
Medication refill request: provera  Last AEX:  06/18/22 Next AEX: nothing scheduled  Last MMG (if hormonal medication request): n/a Refill authorized: please advise

## 2023-04-12 IMAGING — MG MM DIGITAL SCREENING BILAT W/ TOMO AND CAD
6 of 10 series · 6 of 30 positions shown · non-contrast
Comparison: Previous exams 04/10/2021 and earlier from [facility].

CLINICAL DATA: Screening.

EXAM:
DIGITAL SCREENING BILATERAL MAMMOGRAM WITH TOMOSYNTHESIS AND CAD
TECHNIQUE: Bilateral screening digital craniocaudal and mediolateral oblique
mammograms were obtained. Bilateral screening digital breast
tomosynthesis was performed. The images were evaluated with
computer-aided detection.

[L XCCL synth-2D]
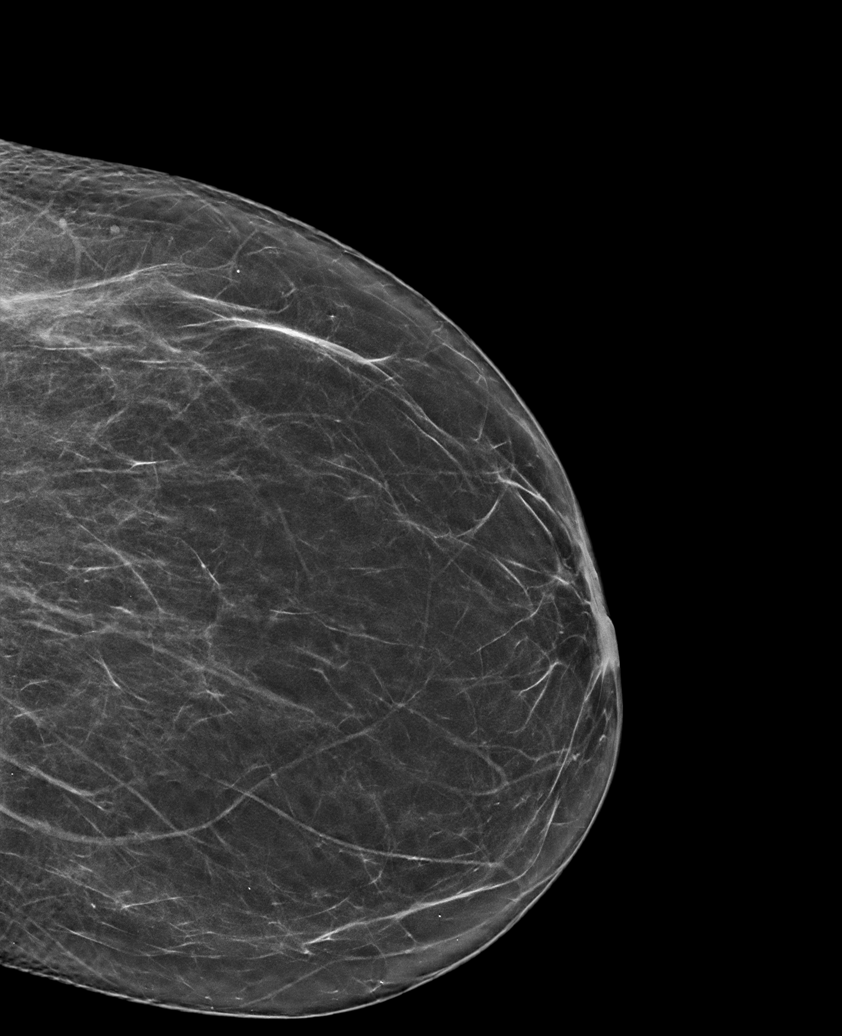

[R CC synth-2D]
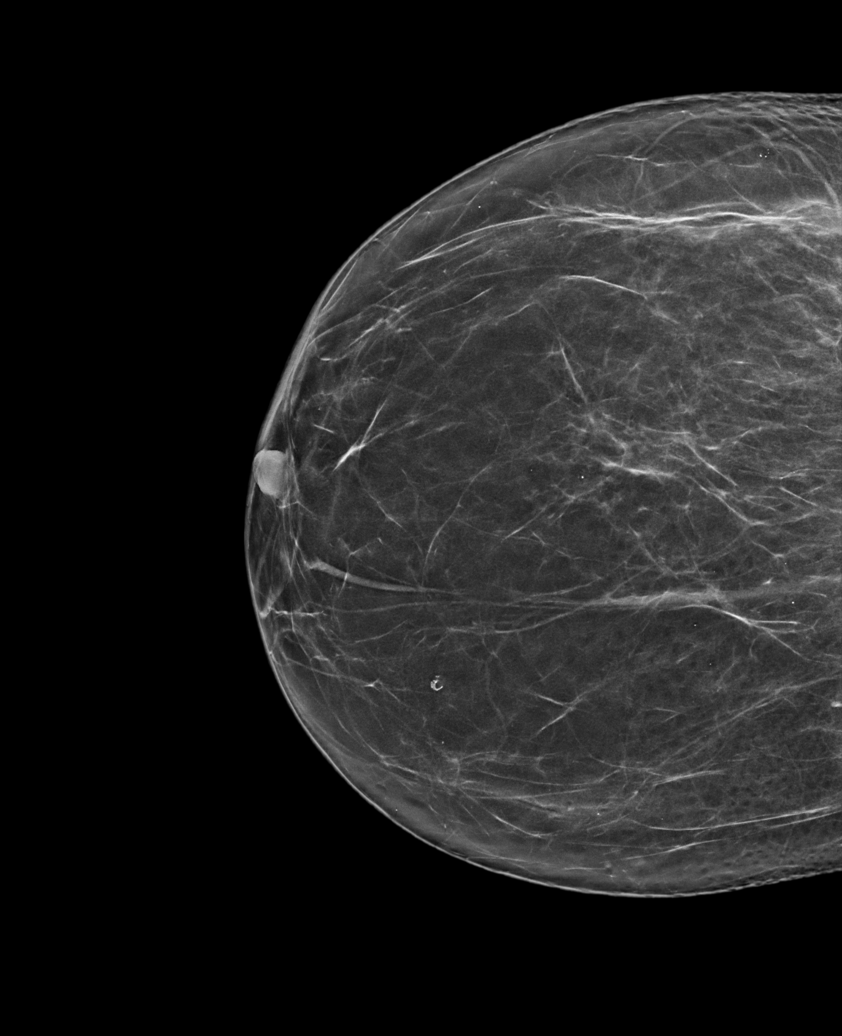

[L MLO synth-2D]
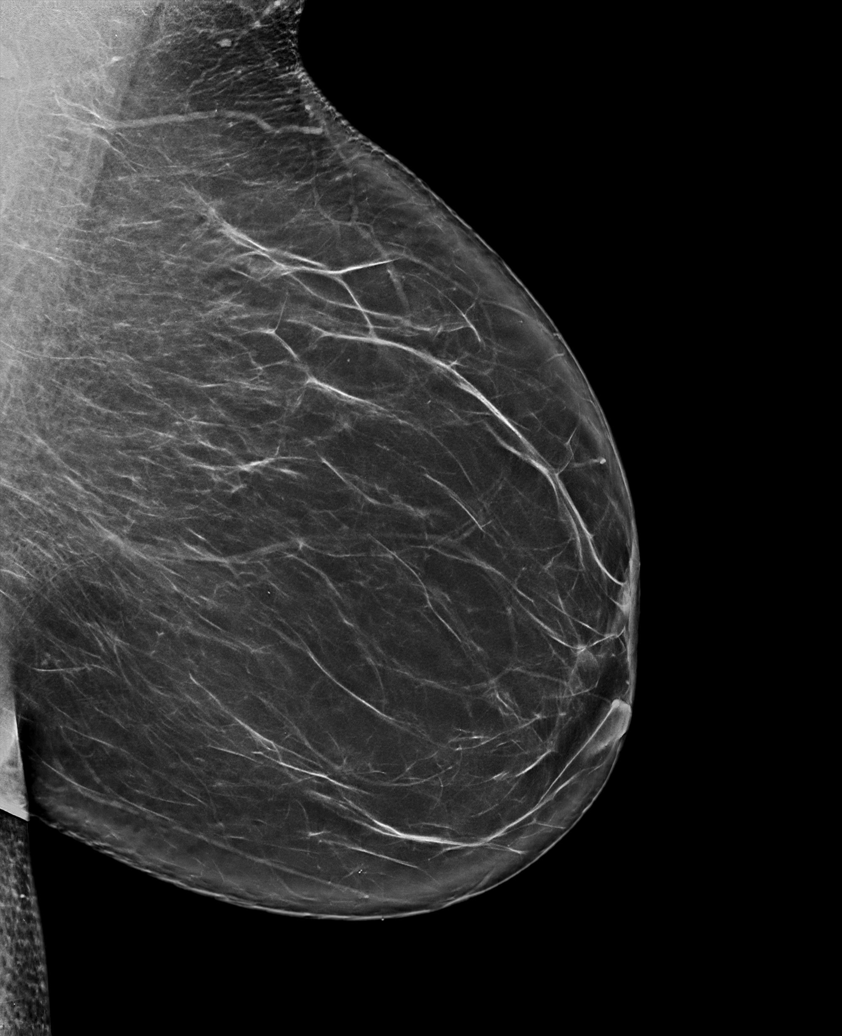

[R MLO synth-2D]
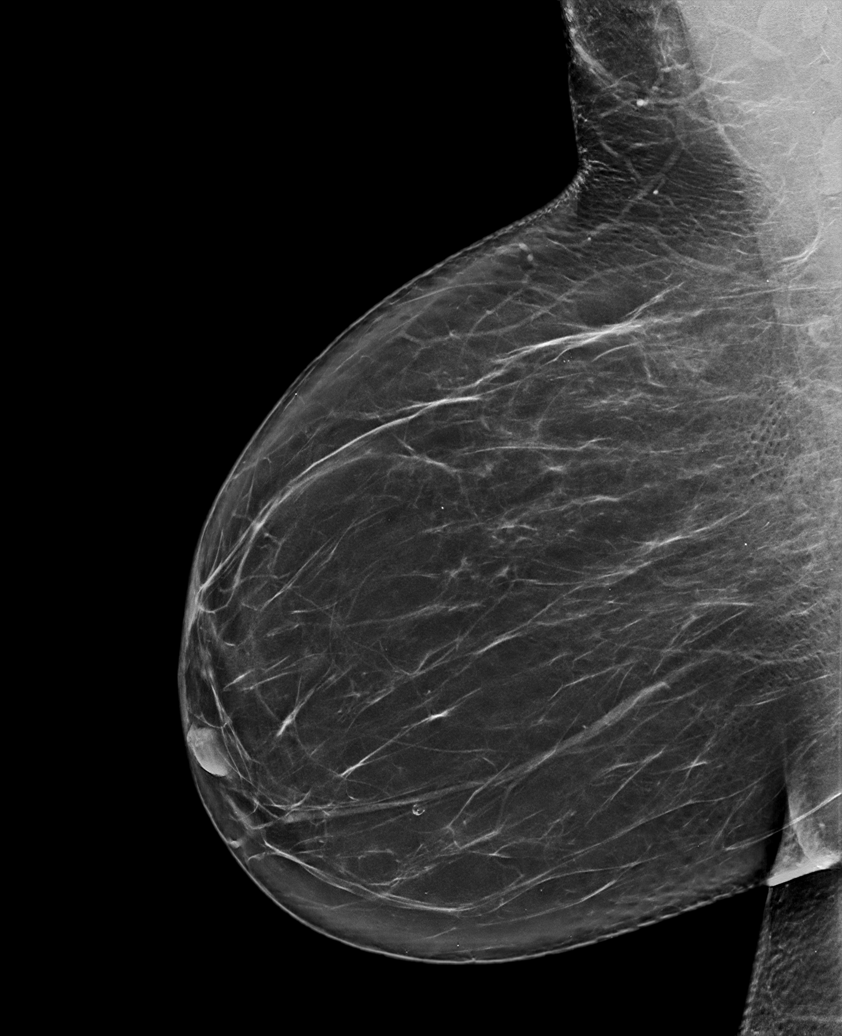

[L CC synth-2D]
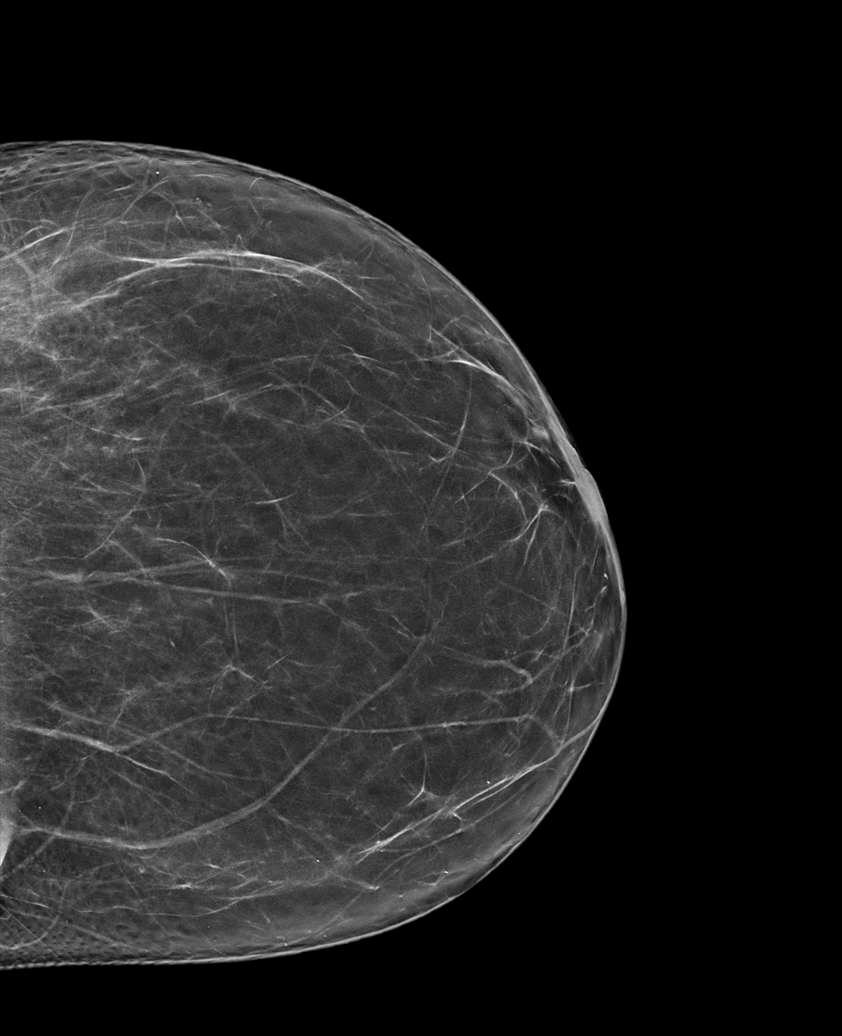

[L CC tomo · tomo slice 37/72.0]
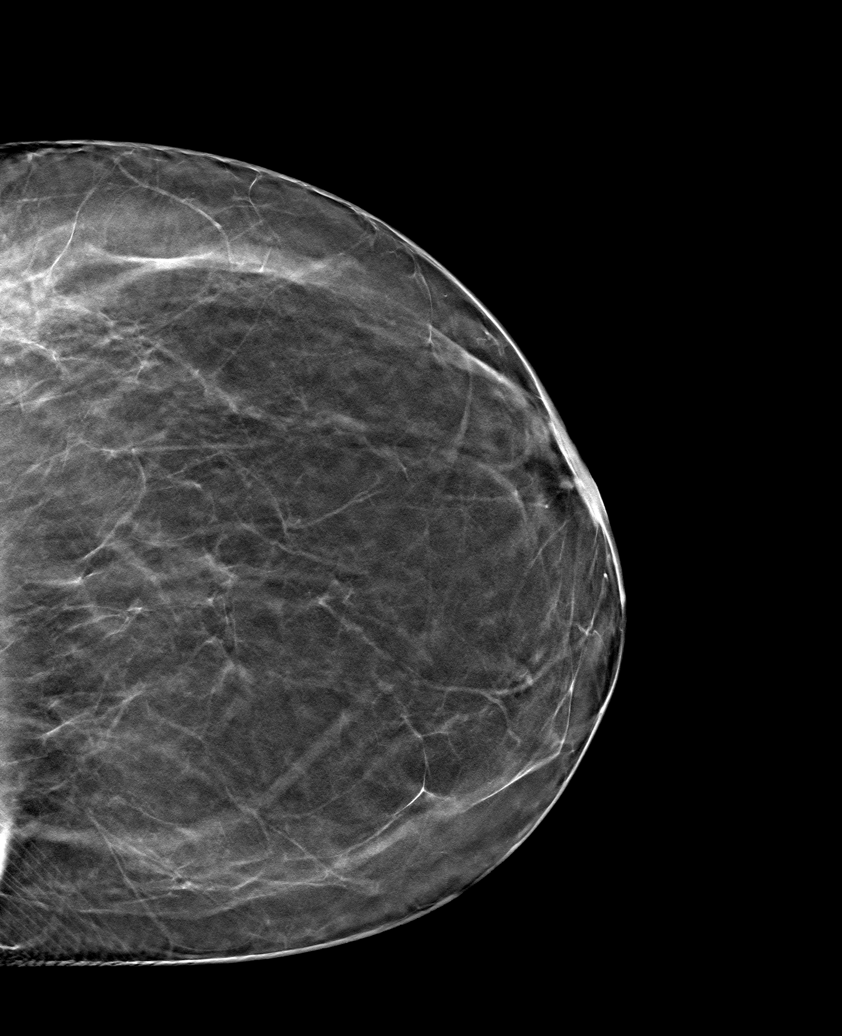

[6 of 30 positions shown; findings below may reference images not displayed]

Awaiting the prior outside mammograms accounts for
the delay in this report.

ACR Breast Density Category b: There are scattered areas of
fibroglandular density.
FINDINGS: There are no findings suspicious for malignancy.
IMPRESSION: No mammographic evidence of malignancy. A result letter of this
screening mammogram will be mailed directly to the patient.

RECOMMENDATION:
Screening mammogram in one year. (Code:A5-G-2MJ)

BI-RADS CATEGORY  1: Negative.

## 2023-04-16 ENCOUNTER — Other Ambulatory Visit (HOSPITAL_BASED_OUTPATIENT_CLINIC_OR_DEPARTMENT_OTHER): Payer: Self-pay

## 2023-04-21 ENCOUNTER — Ambulatory Visit: Payer: 59 | Admitting: Family

## 2023-05-23 ENCOUNTER — Other Ambulatory Visit: Payer: Self-pay | Admitting: Family Medicine

## 2023-05-23 DIAGNOSIS — Z6841 Body Mass Index (BMI) 40.0 and over, adult: Secondary | ICD-10-CM

## 2023-05-24 ENCOUNTER — Other Ambulatory Visit (HOSPITAL_BASED_OUTPATIENT_CLINIC_OR_DEPARTMENT_OTHER): Payer: Self-pay

## 2023-05-24 ENCOUNTER — Other Ambulatory Visit: Payer: Self-pay

## 2023-05-24 MED ORDER — PHENTERMINE HCL 37.5 MG PO CAPS
37.5000 mg | ORAL_CAPSULE | Freq: Every day | ORAL | 0 refills | Status: DC
Start: 2023-05-24 — End: 2023-08-09
  Filled 2023-05-24: qty 30, 30d supply, fill #0

## 2023-05-24 NOTE — Telephone Encounter (Signed)
Needs appt within 1 month

## 2023-05-24 NOTE — Telephone Encounter (Signed)
Patient notified and verbalized understanding. 

## 2023-06-01 ENCOUNTER — Encounter: Payer: Self-pay | Admitting: Family Medicine

## 2023-06-01 ENCOUNTER — Ambulatory Visit: Payer: 59 | Admitting: Family Medicine

## 2023-06-01 ENCOUNTER — Other Ambulatory Visit (HOSPITAL_BASED_OUTPATIENT_CLINIC_OR_DEPARTMENT_OTHER): Payer: Self-pay

## 2023-06-01 VITALS — BP 131/72 | HR 82 | Temp 98.0°F | Resp 18 | Ht 70.0 in | Wt 315.4 lb

## 2023-06-01 DIAGNOSIS — N3000 Acute cystitis without hematuria: Secondary | ICD-10-CM

## 2023-06-01 DIAGNOSIS — R8290 Unspecified abnormal findings in urine: Secondary | ICD-10-CM | POA: Diagnosis not present

## 2023-06-01 DIAGNOSIS — R3915 Urgency of urination: Secondary | ICD-10-CM | POA: Diagnosis not present

## 2023-06-01 DIAGNOSIS — Z6841 Body Mass Index (BMI) 40.0 and over, adult: Secondary | ICD-10-CM

## 2023-06-01 LAB — POC URINALSYSI DIPSTICK (AUTOMATED)
Bilirubin, UA: NEGATIVE
Blood, UA: NEGATIVE
Glucose, UA: NEGATIVE
Ketones, UA: NEGATIVE
Leukocytes, UA: NEGATIVE
Nitrite, UA: POSITIVE
Protein, UA: NEGATIVE
Spec Grav, UA: >=1.03 — AB (ref 1.010–1.025)
Urobilinogen, UA: 0.2 U/dL
pH, UA: 5.5 (ref 5.0–8.0)

## 2023-06-01 MED ORDER — WEGOVY 0.25 MG/0.5ML ~~LOC~~ SOAJ
0.2500 mg | SUBCUTANEOUS | 0 refills | Status: AC
  Filled 2023-06-01 – 2023-09-14 (×6): qty 2, 28d supply, fill #0

## 2023-06-01 MED ORDER — CEPHALEXIN 500 MG PO CAPS
500.0000 mg | ORAL_CAPSULE | Freq: Two times a day (BID) | ORAL | 0 refills | Status: AC
  Filled 2023-06-01: qty 14, 7d supply, fill #0

## 2023-06-01 NOTE — Progress Notes (Signed)
Subjective:    Patient ID: Taylor Solis, female    DOB: October 07, 1975, 47 y.o.   MRN: [id number]  Chief Complaint  Patient presents with   Medication Follow-up   Urinary Urgency    Started 2 days ago, cloudy urine   HPI Urinary Urgency - Noticed a cloudy urine a few days ago. For the past 2 days, has had urgency. Sometimes thinks she wont make it to the bathroom. No changes in sexual activity. Last SA 2 weeks ago. Denies diarrhea. Last urine infection July 12th.  No dysuria, abd pain, back pain.  No f/c.  Weight Management - She is taking phentermine 37.5 mg daily, tolerating well. States phentermine has helped her. Peek weight was 350 lbs. Reports she has reached a plateau now staying around 310 - 320 lbs. Has not tried wegovy or any other medications for weight loss. Is walking regularly and doing 10-15 minute work out videos every other day.  Just stuck on plateau.  Moods - On Lexapro 20 mg and Buspar 10 mg for anxiety. Doing well. No SI.   HTN - Pt is on Lisinopril 10 mg and Hydrochlorothiazide 12.5 mg.  No ha/dizziness/cp/palp/edema/cough/sob. Bp at today's visit was 121/72. Health Maintenance Due  Topic Date Due   Cervical Cancer Screening (HPV/Pap Cotest)  Never done    Past Medical History:  Diagnosis Date   Anxiety    Depression    Hypertension    Thyroid disease     Past Surgical History:  Procedure Laterality Date   CESAREAN SECTION     9562,1308   CHOLECYSTECTOMY     2017     Current Outpatient Medications:    busPIRone (BUSPAR) 10 MG tablet, Take 1 tablet (10 mg total) by mouth daily., Disp: 90 tablet, Rfl: 3   cephALEXin (KEFLEX) 500 MG capsule, Take 1 capsule (500 mg total) by mouth 2 (two) times daily for 7 days. Take for 7 days, Disp: 14 capsule, Rfl: 0   clindamycin (CLINDAGEL) 1 % gel, Apply topically 2 (two) times daily., Disp: 60 g, Rfl: 3   escitalopram (LEXAPRO) 20 MG tablet, Take 1 tablet (20 mg total) by mouth daily., Disp: 90 tablet, Rfl:  1   hydrochlorothiazide (HYDRODIURIL) 12.5 MG tablet, Take 1 tablet (12.5 mg total) by mouth daily., Disp: 90 tablet, Rfl: 3   levothyroxine (SYNTHROID) 75 MCG tablet, Take 1 tablet (75 mcg total) by mouth daily., Disp: 90 tablet, Rfl: 3   lisinopril (ZESTRIL) 10 MG tablet, Take 1 tablet (10 mg total) by mouth daily., Disp: 90 tablet, Rfl: 3   medroxyPROGESTERone (PROVERA) 10 MG tablet, Take 1 tablet (10 mg total) by mouth daily., Disp: 10 tablet, Rfl: 0   omeprazole (PRILOSEC) 40 MG capsule, Take 1 capsule (40 mg total) by mouth in the morning and at bedtime., Disp: 180 capsule, Rfl: 0   phentermine 37.5 MG capsule, Take 1 capsule (37.5 mg total) by mouth daily., Disp: 30 capsule, Rfl: 0   Semaglutide-Weight Management (WEGOVY) 0.25 MG/0.5ML SOAJ, Inject 0.25 mg into the skin once a week., Disp: 2 mL, Rfl: 0   simethicone (MYLICON) 125 MG chewable tablet, Chew by mouth every 6 (six) hours as needed., Disp: 60 tablet, Rfl: 1   VITAMIN D PO, Take by mouth. 5000 mg once a week, Disp: , Rfl:    Wheat Dextrin (BENEFIBER) POWD, take once by mouth daily in the afternoon., Disp: 152 g, Rfl: 2  No Known Allergies ROS neg/noncontributory except as noted HPI/below  Objective:     BP 131/72   Pulse 82   Temp 98 F (36.7 C) (Temporal)   Resp 18   Ht 5\' 10"  (1.778 m)   Wt (!) 315 lb 6 oz (143.1 kg)   SpO2 97%   BMI 45.25 kg/m  Wt Readings from Last 3 Encounters:  06/01/23 (!) 315 lb 6 oz (143.1 kg)  01/24/23 (!) 315 lb 2 oz (142.9 kg)  01/21/23 (!) 318 lb 2 oz (144.3 kg)    Physical Exam   Gen: WDWN NAD HEENT: NCAT, conjunctiva not injected, sclera nonicteric NECK:  supple, no thyromegaly, no nodes, no carotid bruits CARDIAC: RRR, S1S2+, no murmur. DP 2+B LUNGS: CTAB. No wheezes ABDOMEN:  BS+, soft, NTND, No HSM, no masses.  No CVAT EXT:  no edema MSK: no gross abnormalities.  NEURO: A&O x3.  CN II-XII intact.  PSYCH: normal mood. Good eye contact Results for orders placed or  performed in visit on 06/01/23  POCT Urinalysis Dipstick (Automated)  Result Value Ref Range   Color, UA YELLOW    Clarity, UA SLIGHTLY CLOUDY    Glucose, UA Negative Negative   Bilirubin, UA NEG    Ketones, UA NEG    Spec Grav, UA >=1.030 (A) 1.010 - 1.025   Blood, UA NEG    pH, UA 5.5 5.0 - 8.0   Protein, UA Negative Negative   Urobilinogen, UA 0.2 0.2 or 1.0 E.U./dL   Nitrite, UA POS    Leukocytes, UA Negative Negative      Assessment & Plan:  Acute cystitis without hematuria  Urinary urgency -     POCT Urinalysis Dipstick (Automated) -     Urine Culture; Future  Slightly cloudy urine -     POCT Urinalysis Dipstick (Automated) -     Urine Culture; Future  BMI 45.0-49.9, adult (HCC) -     [id number]; Inject 0.25 mg into the skin once a week.  Dispense: 2 mL; Refill: 0  Other orders -     Cephalexin; Take 1 capsule (500 mg total) by mouth 2 (two) times daily for 7 days. Take for 7 days  Dispense: 14 capsule; Refill: 0  1.  UTI-check culture.  Keflex 500 mg twice daily x 7 days.  Increase fluids and do not hold the urine 2.  Obesity-patient has been on phentermine 37.5 mg daily for many months.  Weight has dropped from 350 to 315 pounds but has plateaued for many months.  Continue working on diet/exercise.  Will stop phentermine as ineffective currently.  Start Wegovy 0.25 mg weekly x 4 then 0.5 mg weekly x 4 then 1 mg, then 1.7, then 2.4 if tolerated  Return in about 2 months (around 08/01/2023) for mood.  Germaine Pomfret Rice,acting as a scribe for Angelena Sole, MD.,have documented all relevant documentation on the behalf of Angelena Sole, MD,as directed by  Angelena Sole, MD while in the presence of Angelena Sole, MD.  I, Angelena Sole, MD, have reviewed all documentation for this visit. The documentation on 06/01/23 for the exam, diagnosis, procedures, and orders are all accurate and complete.   Angelena Sole, MD

## 2023-06-01 NOTE — Patient Instructions (Signed)

## 2023-06-03 ENCOUNTER — Other Ambulatory Visit (HOSPITAL_COMMUNITY): Payer: Self-pay

## 2023-06-03 ENCOUNTER — Other Ambulatory Visit (HOSPITAL_BASED_OUTPATIENT_CLINIC_OR_DEPARTMENT_OTHER): Payer: Self-pay

## 2023-06-03 ENCOUNTER — Telehealth: Payer: Self-pay

## 2023-06-03 NOTE — Telephone Encounter (Signed)
Hi,    We have received a request for ''Wegovy'' Via Cover My Meds. However [facility] Plan Doesn't Cover Any Weight Loss Injections. I have also Attempted to run the patients ''Medicaid'' on file and it shows that the [facility] Plan has been terminated.    Thanks,

## 2023-06-03 NOTE — Telephone Encounter (Signed)
Please see message below and advise.

## 2023-06-04 LAB — URINE CULTURE
MICRO NUMBER:: 15757010
SPECIMEN QUALITY:: ADEQUATE

## 2023-06-06 ENCOUNTER — Other Ambulatory Visit: Payer: Self-pay | Admitting: Family Medicine

## 2023-06-06 ENCOUNTER — Other Ambulatory Visit (HOSPITAL_BASED_OUTPATIENT_CLINIC_OR_DEPARTMENT_OTHER): Payer: Self-pay

## 2023-06-06 MED ORDER — SULFAMETHOXAZOLE-TRIMETHOPRIM 800-160 MG PO TABS
1.0000 | ORAL_TABLET | Freq: Two times a day (BID) | ORAL | 0 refills | Status: DC
  Filled 2023-06-06: qty 14, 7d supply, fill #0

## 2023-06-06 NOTE — Telephone Encounter (Signed)
Spoke to patient and she stated she will call insurance company and find out what's going on.

## 2023-06-06 NOTE — Progress Notes (Signed)
Lmam X 2 OVER THE weekend, no response.  D/w pt, phone # correct, went to spam.  Resistant bacteria.  Will change to bactrim-pt still w/symptoms

## 2023-06-11 ENCOUNTER — Other Ambulatory Visit (HOSPITAL_BASED_OUTPATIENT_CLINIC_OR_DEPARTMENT_OTHER): Payer: Self-pay

## 2023-06-15 ENCOUNTER — Other Ambulatory Visit (HOSPITAL_BASED_OUTPATIENT_CLINIC_OR_DEPARTMENT_OTHER): Payer: Self-pay

## 2023-06-17 ENCOUNTER — Other Ambulatory Visit (HOSPITAL_BASED_OUTPATIENT_CLINIC_OR_DEPARTMENT_OTHER): Payer: Self-pay

## 2023-07-07 ENCOUNTER — Other Ambulatory Visit (HOSPITAL_BASED_OUTPATIENT_CLINIC_OR_DEPARTMENT_OTHER): Payer: Self-pay

## 2023-07-11 ENCOUNTER — Ambulatory Visit: Payer: 59 | Admitting: Family Medicine

## 2023-07-11 ENCOUNTER — Encounter: Payer: Self-pay | Admitting: Family Medicine

## 2023-07-11 VITALS — BP 132/84 | HR 75 | Temp 97.7°F | Ht 70.0 in | Wt 312.0 lb

## 2023-07-11 DIAGNOSIS — R52 Pain, unspecified: Secondary | ICD-10-CM | POA: Diagnosis not present

## 2023-07-11 DIAGNOSIS — J020 Streptococcal pharyngitis: Secondary | ICD-10-CM | POA: Diagnosis not present

## 2023-07-11 DIAGNOSIS — R051 Acute cough: Secondary | ICD-10-CM

## 2023-07-11 DIAGNOSIS — J029 Acute pharyngitis, unspecified: Secondary | ICD-10-CM

## 2023-07-11 LAB — POCT RAPID STREP A (OFFICE): Rapid Strep A Screen: POSITIVE — AB

## 2023-07-11 LAB — POCT INFLUENZA A/B
Influenza A, POC: NEGATIVE
Influenza B, POC: NEGATIVE

## 2023-07-11 LAB — POC COVID19 BINAXNOW: SARS Coronavirus 2 Ag: NEGATIVE

## 2023-07-11 MED ORDER — PENICILLIN G BENZATHINE 1200000 UNIT/2ML IM SUSY
1.2000 10*6.[IU] | PREFILLED_SYRINGE | Freq: Once | INTRAMUSCULAR | Status: AC
  Administered 2023-07-11: 1.2 10*6.[IU] via INTRAMUSCULAR

## 2023-07-11 NOTE — Progress Notes (Signed)
Subjective  CC:  Chief Complaint  Patient presents with   Cough    Pt stated that she has been experiencing diarrhea and a sore throat on 07/08/2023 and then all the other symptoms came on like cough, congestion, body ache, and chills.    HPI: LYNESE Solis is a 47 y.o. female who presents to the office today to address the problems listed above in the chief complaint. Discussed the use of AI scribe software for clinical note transcription with the patient, who gave verbal consent to proceed.  History of Present Illness   The patient, a 47 year old, presents with a severe sore throat, body aches, and a exposure to husband who is recovering from a recent sinus infection.  Symptoms started several days ago.  Subjective fevers and sweats.  Some coughing and some bodyaches.  No nausea or vomiting but she did have some diarrhea.  Multiple sick contacts at the home.  Sore throat is the worst symptom.  No shortness of breath.  Assessment  1. Strep throat   2. Sore throat   3. Acute cough   4. Body aches      Plan  Assessment and Plan    Streptococcal Pharyngitis Positive strep test. Symptoms include severe sore throat, swollen glands, difficulty swallowing, body aches, and intermittent fever. COVID-19 and flu tests are negative. Symptoms began Friday, worsening by Saturday. Discussed treatment options including oral antibiotics and bicillin shot. Explained that antibiotics typically improve symptoms within 24-48 hours. Discussed ibuprofen for symptom relief and the importance of rest and hydration. Advised against returning to work until [employer] and able to function. - Administer antibiotic: bicillin LA 1.2 million units IM x 1 - Recommend ibuprofen for symptom relief (up to three times a day with food) - Advise rest and hydration - Consider Mucinex DM for congestion - Advise against returning to work until fever-free and able to function Follow-up - Follow up if symptoms do  not improve within 48 hours - Reassess ability to return to work by Thursday.        Orders Placed This Encounter  Procedures   POC COVID-19   POCT Influenza A/B   POCT rapid strep A   Meds ordered this encounter  Medications   penicillin g benzathine (BICILLIN LA) 1200000 UNIT/2ML injection 1.2 Million Units    Antibiotic Indication::   Pharyngitis     I reviewed the patients updated PMH, FH, and SocHx.    Patient Active Problem List   Diagnosis Date Noted   BMI 45.0-49.9, adult (HCC) 12/31/2021   Rosacea, acne 11/20/2021   Piriformis syndrome, right 11/20/2021   Acquired hypothyroidism 11/20/2021   Primary hypertension 11/20/2021   Generalized anxiety disorder 11/20/2021   Depression, major, single episode, in partial remission (HCC) 11/20/2021   No outpatient medications have been marked as taking for the 07/11/23 encounter (Office Visit) with [name], MD.   Current Facility-Administered Medications for the 07/11/23 encounter (Office Visit) with [name], MD  Medication   penicillin g benzathine (BICILLIN LA) 1200000 UNIT/2ML injection 1.2 Million Units    Allergies: Patient has no known allergies. Family History: Patient family history includes Anxiety disorder in her mother; Arthritis in her mother; Asthma in her sister; COPD in her mother; Cancer in her father and mother; Depression in her mother and sister; Diabetes in her maternal grandmother and paternal grandmother; Emphysema in her mother; Heart disease in her maternal grandmother; Hyperlipidemia in her mother; Hypertension in her maternal grandmother,  mother, and paternal grandmother; Leukemia in her father; Lung cancer in her mother. Social History:  Patient  reports that she has never smoked. She has been exposed to tobacco smoke. She has never used smokeless tobacco. She reports that she does not currently use alcohol. She reports that she does not use drugs.  Review of  Systems: Constitutional: Negative for fever malaise or anorexia Cardiovascular: negative for chest pain Respiratory: negative for SOB or persistent cough Gastrointestinal: negative for abdominal pain  Objective  Vitals: BP 132/84   Pulse 75   Temp 97.7 F (36.5 C)   Ht 5\' 10"  (1.778 m)   Wt (!) 312 lb (141.5 kg)   SpO2 97%   BMI 44.77 kg/m  General: no acute distress , A&Ox3, non toxic HEENT: PEERL, conjunctiva normal, neck is supple, post pharynx red w/o exudate, + cervical LAD, midline uvula Cardiovascular:  RRR without murmur or gallop.  Respiratory:  Good breath sounds bilaterally, CTAB with normal respiratory effort Skin:  Warm, no rashes Office Visit on 07/11/2023  Component Date Value Ref Range Status   SARS Coronavirus 2 Ag 07/11/2023 Negative  Negative Final   Influenza A, POC 07/11/2023 Negative  Negative Final   Influenza B, POC 07/11/2023 Negative  Negative Final   Rapid Strep A Screen 07/11/2023 Positive (A)  Negative Final    Commons side effects, risks, benefits, and alternatives for medications and treatment plan prescribed today were discussed, and the patient expressed understanding of the given instructions. Patient is instructed to call or message via MyChart if he/she has any questions or concerns regarding our treatment plan. No barriers to understanding were identified. We discussed Red Flag symptoms and signs in detail. Patient expressed understanding regarding what to do in case of urgent or emergency type symptoms.  Medication list was reconciled, printed and provided to the patient in AVS. Patient instructions and summary information was reviewed with the patient as documented in the AVS. This note was prepared with assistance of Dragon voice recognition software. Occasional wrong-word or sound-a-like substitutions may have occurred due to the inherent limitations of voice recognition software

## 2023-07-11 NOTE — Patient Instructions (Signed)
Please follow up if symptoms do not improve or as needed.    VISIT SUMMARY:  Today, you were seen for a severe sore throat, body aches, and symptoms of a recent sinus infection. You were diagnosed with strep throat, and we discussed your treatment plan, including antibiotics and symptom relief measures. We also reviewed your previous sinusitis diagnosis and the side effects you experienced from the antibiotic.  YOUR PLAN:  -STREPTOCOCCAL PHARYNGITIS: Streptococcal pharyngitis, commonly known as strep throat, is a bacterial infection that causes a severe sore throat, swollen glands, difficulty swallowing, body aches, and fever. We will treat this with antibiotics, which should improve your symptoms within 24-48 hours. You can take ibuprofen up to three times a day with food for pain relief, and it's important to rest and stay hydrated. You may also consider taking Mucinex DM for congestion. Please avoid returning to work until you are fever-free and feel well enough to function.  INSTRUCTIONS:  Please follow up if your symptoms do not improve within 48 hours. We will reassess your ability to return to work by Thursday.  Strep Throat, Adult Strep throat is an infection in the throat that is caused by bacteria. It is common during the cold months of the year. It mostly affects children who are 74-28 years old. However, people of all ages can get it at any time of the year. This infection spreads from person to person (is contagious) through coughing, sneezing, or having close contact. Your health care provider may use other names to describe the infection. When strep throat affects the tonsils, it is called tonsillitis. When it affects the back of the throat, it is called pharyngitis. What are the causes? This condition is caused by the Streptococcus pyogenes bacteria. What increases the risk? You are more likely to develop this condition if: You care for school-age children, or are around  school-age children. Children are more likely to get strep throat and may spread it to others. You spend time in crowded places where the infection can spread easily. You have close contact with someone who has strep throat. What are the signs or symptoms? Symptoms of this condition include: Fever or chills. Redness, swelling, or pain in the tonsils or throat. Pain or difficulty when swallowing. White or yellow spots on the tonsils or throat. Tender glands in the neck and under the jaw. Bad smelling breath. Red rash all over the body. This is rare. How is this diagnosed? This condition is diagnosed by tests that check for the presence and the amount of bacteria that cause strep throat. They are: Rapid strep test. Your throat is swabbed and checked for the presence of bacteria. Results are usually ready in minutes. Throat culture test. Your throat is swabbed. The sample is placed in a cup that allows infections to grow. Results are usually ready in 1 or 2 days. How is this treated? This condition may be treated with: Medicines that kill germs (antibiotics). Medicines that relieve pain or fever. These include: Ibuprofen or acetaminophen. Aspirin, only for people who are over the age of 21. Throat lozenges. Throat sprays. Follow these instructions at home: Medicines  Take over-the-counter and prescription medicines only as told by your health care provider. Take your antibiotic medicine as told by your health care provider. Do not stop taking the antibiotic even if you start to feel better. Eating and drinking  If you have trouble swallowing, try eating soft foods until your sore throat feels better. Drink enough fluid to  keep your urine pale yellow. To help relieve pain, you may have: Warm fluids, such as soup and tea. Cold fluids, such as frozen desserts or popsicles. General instructions Gargle with a salt-water mixture 3-4 times a day or as needed. To make a salt-water mixture,  completely dissolve -1 tsp (3-6 g) of salt in 1 cup (237 mL) of warm water. Get plenty of rest. Stay home from work or school until you have been taking antibiotics for 24 hours. Do not use any products that contain nicotine or tobacco. These products include cigarettes, chewing tobacco, and vaping devices, such as e-cigarettes. If you need help quitting, ask your health care provider. It is up to you to get your test results. Ask your health care provider, or the department that is doing the test, when your results will be ready. Keep all follow-up visits. This is important. How is this prevented?  Do not share food, drinking cups, or personal items that could cause the infection to spread to other people. Wash your hands often with soap and water for at least 20 seconds. If soap and water are not available, use hand sanitizer. Make sure that all people in your house wash their hands well. Have family members tested if they have a sore throat or fever. They may need an antibiotic if they have strep throat. Contact a health care provider if: You have swelling in your neck that keeps getting bigger. You develop a rash, cough, or earache. You cough up a thick mucus that is green, yellow-brown, or bloody. You have pain or discomfort that does not get better with medicine. Your symptoms seem to be getting worse. You have a fever. Get help right away if: You have new symptoms, such as vomiting, severe headache, stiff or painful neck, chest pain, or shortness of breath. You have severe throat pain, drooling, or changes in your voice. You have swelling of the neck, or the skin on the neck becomes red and tender. You have signs of dehydration, such as tiredness (fatigue), dry mouth, and decreased urination. You become increasingly sleepy, or you cannot wake up completely. Your joints become red or painful. These symptoms may represent a serious problem that is an emergency. Do not wait to see if  the symptoms will go away. Get medical help right away. Call your local emergency services (911 in the [place] Do not drive yourself to the hospital. Summary Strep throat is an infection in the throat that is caused by the Streptococcus pyogenes bacteria. This infection is spread from person to person (is contagious) through coughing, sneezing, or having close contact. Take your medicines, including antibiotics, as told by your health care provider. Do not stop taking the antibiotic even if you start to feel better. To prevent the spread of germs, wash your hands well with soap and water. Have others do the same. Do not share food, drinking cups, or personal items. Get help right away if you have new symptoms, such as vomiting, severe headache, stiff or painful neck, chest pain, or shortness of breath. This information is not intended to replace advice given to you by your health care provider. Make sure you discuss any questions you have with your health care provider. Document Revised: 10/21/2020 Document Reviewed: 10/21/2020 Elsevier Patient Education  2024 [employer].

## 2023-07-14 ENCOUNTER — Other Ambulatory Visit (HOSPITAL_BASED_OUTPATIENT_CLINIC_OR_DEPARTMENT_OTHER): Payer: Self-pay

## 2023-07-14 ENCOUNTER — Encounter: Payer: Self-pay | Admitting: Family Medicine

## 2023-07-14 MED ORDER — AZITHROMYCIN 250 MG PO TABS
ORAL_TABLET | ORAL | 0 refills | Status: DC
  Filled 2023-07-14: qty 6, 5d supply, fill #0

## 2023-07-14 MED ORDER — GUAIFENESIN-CODEINE 100-10 MG/5ML PO SOLN
5.0000 mL | Freq: Four times a day (QID) | ORAL | 0 refills | Status: DC | PRN
  Filled 2023-07-14: qty 120, 6d supply, fill #0

## 2023-07-14 NOTE — Telephone Encounter (Signed)
 Zpak and rob AC sent in to cover for bacterial bronchitis/CAP

## 2023-07-14 NOTE — Telephone Encounter (Signed)
 Telephone call to patient: Throat is feeling better but running fever, Tmax 100 this morning.  Productive hacking harsh cough that is painful but no pleuritic chest pain or shortness of breath.  No wheezing.  Also feeling tired and achy.  Treated for strep throat 3 days ago with Bicillin  LA.

## 2023-07-15 ENCOUNTER — Ambulatory Visit
Admission: EM | Admit: 2023-07-15 | Discharge: 2023-07-15 | Disposition: A | Discharge status: Home / Self Care | Payer: 59 | Attending: Family Medicine | Admitting: Family Medicine

## 2023-07-15 DIAGNOSIS — J9801 Acute bronchospasm: Secondary | ICD-10-CM | POA: Diagnosis not present

## 2023-07-15 DIAGNOSIS — U071 COVID-19: Secondary | ICD-10-CM | POA: Diagnosis not present

## 2023-07-15 MED ORDER — PROMETHAZINE-DM 6.25-15 MG/5ML PO SYRP: 5.0000 mL | ORAL_SOLUTION | Freq: Three times a day (TID) | ORAL | 0 refills | Status: DC | PRN

## 2023-07-15 MED ORDER — PREDNISONE 20 MG PO TABS: ORAL_TABLET | ORAL | 0 refills | Status: DC

## 2023-07-15 NOTE — ED Provider Notes (Addendum)
 [facility]  Note:  This document was prepared using Conservation officer, historic buildings and may include unintentional dictation errors.  MRN: [id number] DOB: 1976-06-15  Subjective:   Taylor Solis is a 48 y.o. female presenting for 1 week history of persistent malaise, fatigue, dry hacking cough.  Tested positive for COVID-19.  She received an injection of an antibiotic 5 days ago for strep, started a Z-Pak yesterday.  No history of asthma.  No smoking of any kind including cigarettes, cigars, vaping, marijuana use.    No current facility-administered medications for this encounter.  Current Outpatient Medications:    azithromycin  (ZITHROMAX ) 250 MG tablet, Take 2 tabs today, then 1 tab daily for 4 days, Disp: 6 each, Rfl: 0   busPIRone  (BUSPAR ) 10 MG tablet, Take 1 tablet (10 mg total) by mouth daily., Disp: 90 tablet, Rfl: 3   clindamycin  (CLINDAGEL ) 1 % gel, Apply topically 2 (two) times daily., Disp: 60 g, Rfl: 3   escitalopram  (LEXAPRO ) 20 MG tablet, Take 1 tablet (20 mg total) by mouth daily., Disp: 90 tablet, Rfl: 1   guaiFENesin -codeine  100-10 MG/5ML syrup, Take 5 mLs by mouth every 6 (six) hours as needed for cough., Disp: 120 mL, Rfl: 0   hydrochlorothiazide  (HYDRODIURIL ) 12.5 MG tablet, Take 1 tablet (12.5 mg total) by mouth daily., Disp: 90 tablet, Rfl: 3   levothyroxine  (SYNTHROID ) 75 MCG tablet, Take 1 tablet (75 mcg total) by mouth daily., Disp: 90 tablet, Rfl: 3   lisinopril  (ZESTRIL ) 10 MG tablet, Take 1 tablet (10 mg total) by mouth daily., Disp: 90 tablet, Rfl: 3   medroxyPROGESTERone  (PROVERA ) 10 MG tablet, Take 1 tablet (10 mg total) by mouth daily., Disp: 10 tablet, Rfl: 0   phentermine  37.5 MG capsule, Take 1 capsule (37.5 mg total) by mouth daily., Disp: 30 capsule, Rfl: 0   Semaglutide -Weight Management (WEGOVY ) 0.25 MG/0.5ML SOAJ, Inject 0.25 mg into the skin once a week., Disp: 2 mL, Rfl: 0   sulfamethoxazole -trimethoprim  (BACTRIM  DS)  800-160 MG tablet, Take 1 tablet by mouth 2 (two) times daily., Disp: 14 tablet, Rfl: 0   VITAMIN D  PO, Take by mouth. 5000 mg once a week, Disp: , Rfl:    No Known Allergies  Past Medical History:  Diagnosis Date   Anxiety    Depression    Hypertension    Thyroid  disease      Past Surgical History:  Procedure Laterality Date   CESAREAN SECTION     8002,7987   CHOLECYSTECTOMY     2017    Family History  Problem Relation Age of Onset   Lung cancer Mother    Hypertension Mother    Hyperlipidemia Mother    Emphysema Mother    COPD Mother    Anxiety disorder Mother    Arthritis Mother    Cancer Mother    Depression Mother    Leukemia Father    Cancer Father    Diabetes Maternal Grandmother    Heart disease Maternal Grandmother    Hypertension Maternal Grandmother    Diabetes Paternal Grandmother    Hypertension Paternal Grandmother    Asthma Sister    Depression Sister     Social History   Tobacco Use   Smoking status: Never    Passive exposure: Past   Smokeless tobacco: Never  Vaping Use   Vaping status: Never Used  Substance Use Topics   Alcohol use: Not Currently    Comment: socially   Drug use: Never  ROS   Objective:   Vitals: BP (!) 146/85 (BP Location: Left Arm)   Pulse 83   Temp 99.4 F (37.4 C) (Oral)   Resp 20   SpO2 98%   Physical Exam Constitutional:      General: She is not in acute distress.    Appearance: Normal appearance. She is well-developed. She is not ill-appearing, toxic-appearing or diaphoretic.  HENT:     Head: Normocephalic and atraumatic.     Right Ear: External ear normal.     Left Ear: External ear normal.     Nose: Nose normal.     Mouth/Throat:     Mouth: Mucous membranes are moist.  Eyes:     General: No scleral icterus.       Right eye: No discharge.        Left eye: No discharge.     Extraocular Movements: Extraocular movements intact.  Cardiovascular:     Rate and Rhythm: Normal rate and regular  rhythm.     Heart sounds: Normal heart sounds. No murmur heard.    No friction rub. No gallop.  Pulmonary:     Effort: Pulmonary effort is normal. No respiratory distress.     Breath sounds: No stridor. No wheezing, rhonchi or rales.     Comments: Persistent hacking cough throughout visit.  Chest:     Chest wall: No tenderness.  Skin:    General: Skin is warm and dry.  Neurological:     General: No focal deficit present.     Mental Status: She is alert and oriented to person, place, and time.  Psychiatric:        Mood and Affect: Mood normal.        Behavior: Behavior normal.     Assessment and Plan :   PDMP not reviewed this encounter.  1. COVID-19   2. Bronchospasm    Deferred imaging. Recommended an oral prednisone  course for her bronchospasms in addition to her current antibiotic regimen. Continue with supportive care. Counseled patient on potential for adverse effects with medications prescribed/recommended today, ER and return-to-clinic precautions discussed, patient verbalized understanding.     Christopher Savannah, [place] 07/16/23 [phone]

## 2023-07-15 NOTE — ED Triage Notes (Signed)
 Pt with flu like sx x 1 week-recent dx strep throat-received abx injection 5 days ago and started zpack yesterday-pos covid home test today-NAD-steady gait

## 2023-07-22 ENCOUNTER — Ambulatory Visit: Payer: 59 | Admitting: Family Medicine

## 2023-07-27 ENCOUNTER — Telehealth: Payer: Self-pay

## 2023-07-27 ENCOUNTER — Telehealth: Payer: Self-pay | Admitting: *Deleted

## 2023-07-27 ENCOUNTER — Other Ambulatory Visit (HOSPITAL_COMMUNITY): Payer: Self-pay

## 2023-07-27 NOTE — Telephone Encounter (Signed)
 Pharmacy Patient Advocate Encounter   Received notification from Pt Calls Messages that prior authorization for Wegovy  0.25mg /0.71ml is required/requested.   Insurance verification completed.   The patient is insured through [facility] .   Per test claim: PA required; PA submitted to above mentioned insurance via CoverMyMeds Key/confirmation #/EOC [id number] Status is pending

## 2023-07-27 NOTE — Telephone Encounter (Signed)
 Patient stated she no longer has [employer]. Please try to do PA for Wegovy  0.25 mg through [employer]. Thanks

## 2023-07-27 NOTE — Telephone Encounter (Signed)
 Left message to return call

## 2023-07-27 NOTE — Telephone Encounter (Signed)
 Pharmacy Patient Advocate Encounter  Received notification from [facility] that Prior Authorization for Wegovy  0.25mg /0.71ml has been  cancelled due to the request could not be processed due to the member's coverage has been terminated.    Per test claim: [employer] states to bill to [employer].

## 2023-07-28 NOTE — Telephone Encounter (Signed)
Patient notified and stated that she will check to see if new number is updated in system for [employer].

## 2023-08-02 ENCOUNTER — Ambulatory Visit: Payer: 59 | Admitting: Family Medicine

## 2023-08-04 ENCOUNTER — Ambulatory Visit: Payer: Medicaid Other | Admitting: Radiology

## 2023-08-09 ENCOUNTER — Ambulatory Visit (INDEPENDENT_AMBULATORY_CARE_PROVIDER_SITE_OTHER): Payer: Medicaid Other | Admitting: Family Medicine

## 2023-08-09 ENCOUNTER — Other Ambulatory Visit (HOSPITAL_BASED_OUTPATIENT_CLINIC_OR_DEPARTMENT_OTHER): Payer: Self-pay

## 2023-08-09 VITALS — BP 130/84 | HR 74 | Temp 98.2°F | Resp 18 | Ht 70.0 in | Wt 322.1 lb

## 2023-08-09 DIAGNOSIS — R5382 Chronic fatigue, unspecified: Secondary | ICD-10-CM | POA: Diagnosis not present

## 2023-08-09 DIAGNOSIS — F411 Generalized anxiety disorder: Secondary | ICD-10-CM | POA: Diagnosis not present

## 2023-08-09 DIAGNOSIS — F324 Major depressive disorder, single episode, in partial remission: Secondary | ICD-10-CM

## 2023-08-09 DIAGNOSIS — E039 Hypothyroidism, unspecified: Secondary | ICD-10-CM

## 2023-08-09 DIAGNOSIS — N921 Excessive and frequent menstruation with irregular cycle: Secondary | ICD-10-CM

## 2023-08-09 MED ORDER — ESCITALOPRAM OXALATE 20 MG PO TABS
20.0000 mg | ORAL_TABLET | Freq: Every day | ORAL | 1 refills | Status: AC
  Filled 2023-08-09 – 2023-08-24 (×2): qty 90, 90d supply, fill #0
  Filled 2024-03-08 – 2024-03-22 (×2): qty 90, 90d supply, fill #1

## 2023-08-09 MED ORDER — BUPROPION HCL ER (XL) 150 MG PO TB24
150.0000 mg | ORAL_TABLET | Freq: Every day | ORAL | 1 refills | Status: AC
  Filled 2023-08-09 – 2023-08-24 (×2): qty 30, 30d supply, fill #0
  Filled 2024-03-08 – 2024-03-22 (×2): qty 30, 30d supply, fill #1

## 2023-08-09 NOTE — Progress Notes (Signed)
Subjective:     Patient ID: Taylor Solis, female    DOB: April 13, 1976, 48 y.o.   MRN: [id number]  Chief Complaint  Patient presents with   Follow-up    Follow-up from last appointment Tired constantly since being off phentermine     HPI Discussed the use of AI scribe software for clinical note transcription with the patient, who gave verbal consent to proceed.  History of Present Illness   The patient presents with fatigue and irregular menstrual cycles.  She experiences persistent fatigue, which has intensified since stopping phentermine. Despite sleeping from 8 PM to 6 AM, she feels exhausted and often misses her alarm. Physical activity, such as walking, does not alleviate her tiredness. She takes vitamin D and a multivitamin daily but has not had recent blood work to check B12 levels. + snoring but no observed apneas during sleep, although her husband has sleep apnea. She does not fall asleep unexpectedly during the day but feels she could easily do so if she sits still for a few minutes. Occasionally drinks black tea for caffeine.  She has a history of irregular and heavy menstrual cycles for the past 13 to 15 years, with periods of amenorrhea followed by prolonged and heavy menstruation, sometimes lasting up to a month. Two small fibroids were discovered during pregnancy. Previously prescribed Provera to induce menstruation when she missed a period for a month, but she found the regimen cumbersome and has since stopped using it regularly. She has an upcoming appointment with her gynecologist to address these issues.  She is currently taking Buspar and Lexapro for anxiety, with no thoughts of suicide. She denies feeling depressed but feels her fatigue is impacting her ability to engage in daily activities.   Obesity-working on diet/exercise  trying to get [facility] covered but issues w/insurance-no longer on [employer] and she has verified the Medicaid is active.        Health Maintenance Due  Topic Date Due   Cervical Cancer Screening (HPV/Pap Cotest)  Never done    Past Medical History:  Diagnosis Date   Anxiety    Depression    Hypertension    Thyroid disease     Past Surgical History:  Procedure Laterality Date   CESAREAN SECTION     [id number]   CHOLECYSTECTOMY     2017     Current Outpatient Medications:    buPROPion (WELLBUTRIN XL) 150 MG 24 hr tablet, Take 1 tablet (150 mg total) by mouth daily., Disp: 30 tablet, Rfl: 1   busPIRone (BUSPAR) 10 MG tablet, Take 1 tablet (10 mg total) by mouth daily., Disp: 90 tablet, Rfl: 3   clindamycin (CLINDAGEL) 1 % gel, Apply topically 2 (two) times daily., Disp: 60 g, Rfl: 3   hydrochlorothiazide (HYDRODIURIL) 12.5 MG tablet, Take 1 tablet (12.5 mg total) by mouth daily., Disp: 90 tablet, Rfl: 3   levothyroxine (SYNTHROID) 75 MCG tablet, Take 1 tablet (75 mcg total) by mouth daily., Disp: 90 tablet, Rfl: 3   lisinopril (ZESTRIL) 10 MG tablet, Take 1 tablet (10 mg total) by mouth daily., Disp: 90 tablet, Rfl: 3   medroxyPROGESTERone (PROVERA) 10 MG tablet, Take 1 tablet (10 mg total) by mouth daily., Disp: 10 tablet, Rfl: 0   VITAMIN D PO, Take by mouth. 5000 mg once a week, Disp: , Rfl:    escitalopram (LEXAPRO) 20 MG tablet, Take 1 tablet (20 mg total) by mouth daily., Disp: 90 tablet, Rfl: 1   Semaglutide-Weight Management (WEGOVY)  0.25 MG/0.5ML SOAJ, Inject 0.25 mg into the skin once a week. (Patient not taking: Reported on 08/09/2023), Disp: 2 mL, Rfl: 0  No Known Allergies ROS neg/noncontributory except as noted HPI/below      Objective:     BP 130/84   Pulse 74   Temp 98.2 F (36.8 C) (Temporal)   Resp 18   Ht 5\' 10"  (1.778 m)   Wt (!) 322 lb 2 oz (146.1 kg)   SpO2 95%   BMI 46.22 kg/m  Wt Readings from Last 3 Encounters:  08/09/23 (!) 322 lb 2 oz (146.1 kg)  07/11/23 (!) 312 lb (141.5 kg)  06/01/23 (!) 315 lb 6 oz (143.1 kg)    Physical Exam   Gen: WDWN NAD HEENT:  NCAT, conjunctiva not injected, sclera nonicteric NECK:  supple, no thyromegaly, no nodes, no carotid bruits CARDIAC: RRR, S1S2+, no murmur. DP 2+B LUNGS: CTAB. No wheezes EXT:  no edema MSK: no gross abnormalities.  NEURO: A&O x3.  CN II-XII intact.  PSYCH: normal mood. Good eye contact     Assessment & Plan:  Chronic fatigue -     Comprehensive metabolic panel -     CBC with Differential/Platelet  Menorrhagia with irregular cycle -     CBC with Differential/Platelet -     IBC + Ferritin -     Vitamin B12  Generalized anxiety disorder -     Escitalopram Oxalate; Take 1 tablet (20 mg total) by mouth daily.  Dispense: 90 tablet; Refill: 1  Acquired hypothyroidism -     TSH  Depression, major, single episode, in partial remission (HCC) -     buPROPion HCl ER (XL); Take 1 tablet (150 mg total) by mouth daily.  Dispense: 30 tablet; Refill: 1  Assessment and Plan    Fatigue Chronic fatigue worsened after stopping phentermine, with exhaustion despite adequate sleep. Differential diagnosis includes sleep apnea, iron deficiency, and vitamin B12 deficiency, possibly some depression. Symptoms and spouse's history suggest possible sleep apnea. Prefers non-pharmacological management. Order blood work for iron levels, B12, and other markers. Consider a sleep study if results are inconclusive. Prescribe Wellbutrin XL 150 mg in the morning for energy and depression control.  Menstrual Irregularities Experiences irregular and heavy menstrual periods for 13-15 years, with episodes of amenorrhea followed by menorrhagia. Two small fibroids are present. Not taking Provera due to fatigue and suspects menopause. A gynecology appointment is scheduled for further evaluation and management on February 3rd or 7th..  check labs  Anxiety Anxiety is well-controlled with Buspar and Lexapro. No suicidal ideation is present. Renew Lexapro prescription.   depression-controlled but still some fatigue-?if from  depression.  will start wellbutrin XL 150mg  daily and monitor  SED  General Health Maintenance Regularly takes vitamin D and multivitamins. No recent blood work for B12 or iron levels. Order blood work for iron levels, B12, and other relevant markers.  Follow-up Update insurance details for Wegovy at the pharmacy and resend the prescription after the insurance update.        Return in about 3 months (around 11/07/2023) for chronic follow-up.  Angelena Sole, MD

## 2023-08-09 NOTE — Patient Instructions (Signed)
It was very nice to see you today!  Add wellbutrin in am   PLEASE NOTE:  If you had any lab tests please let us know if you have not heard back within a few days. You may see your results on MyChart before we have a chance to review them but we will give you a call once they are reviewed by [place]. If we ordered any referrals today, please let us know if you have not heard from their office within the next week.   Please try these tips to maintain a healthy lifestyle:  Eat most of your calories during the day when you are active. Eliminate processed foods including packaged sweets (pies, cakes, cookies), reduce intake of potatoes, white bread, white pasta, and white rice. Look for whole grain options, oat flour or almond flour.  Each meal should contain half fruits/vegetables, one quarter protein, and one quarter carbs (no bigger than a computer mouse).  Cut down on sweet beverages. This includes juice, soda, and sweet tea. Also watch fruit intake, though this is a healthier sweet option, it still contains natural sugar! Limit to 3 servings daily.  Drink at least 1 glass of water with each meal and aim for at least 8 glasses per day  Exercise at least 150 minutes every week.

## 2023-08-10 LAB — CBC WITH DIFFERENTIAL/PLATELET
Basophils Absolute: 0.1 10*3/uL (ref 0.0–0.1)
Basophils Relative: 1.4 % (ref 0.0–3.0)
Eosinophils Absolute: 0.1 10*3/uL (ref 0.0–0.7)
Eosinophils Relative: 1.6 % (ref 0.0–5.0)
HCT: 37.5 % (ref 36.0–46.0)
Hemoglobin: 12.4 g/dL (ref 12.0–15.0)
Lymphocytes Relative: 22.9 % (ref 12.0–46.0)
Lymphs Abs: 2.1 10*3/uL (ref 0.7–4.0)
MCHC: 33.1 g/dL (ref 30.0–36.0)
MCV: 83.2 fL (ref 78.0–100.0)
Monocytes Absolute: 0.8 10*3/uL (ref 0.1–1.0)
Monocytes Relative: 8.9 % (ref 3.0–12.0)
Neutro Abs: 6 10*3/uL (ref 1.4–7.7)
Neutrophils Relative %: 65.2 % (ref 43.0–77.0)
Platelets: 320 10*3/uL (ref 150.0–400.0)
RBC: 4.5 Mil/uL (ref 3.87–5.11)
RDW: 14.7 % (ref 11.5–15.5)
WBC: 9.2 10*3/uL (ref 4.0–10.5)

## 2023-08-10 LAB — IBC + FERRITIN
Ferritin: 18.1 ng/mL (ref 10.0–291.0)
Iron: 33 ug/dL — ABNORMAL LOW (ref 42–145)
Saturation Ratios: 9.9 % — ABNORMAL LOW (ref 20.0–50.0)
TIBC: 334.6 ug/dL (ref 250.0–450.0)
Transferrin: 239 mg/dL (ref 212.0–360.0)

## 2023-08-10 LAB — VITAMIN B12: Vitamin B-12: 178 pg/mL — ABNORMAL LOW (ref 211–911)

## 2023-08-10 LAB — COMPREHENSIVE METABOLIC PANEL
ALT: 24 U/L (ref 0–35)
AST: 23 U/L (ref 0–37)
Albumin: 3.9 g/dL (ref 3.5–5.2)
Alkaline Phosphatase: 68 U/L (ref 39–117)
BUN: 12 mg/dL (ref 6–23)
CO2: 31 meq/L (ref 19–32)
Calcium: 9 mg/dL (ref 8.4–10.5)
Chloride: 101 meq/L (ref 96–112)
Creatinine, Ser: 0.85 mg/dL (ref 0.40–1.20)
GFR: 80.26 mL/min (ref >60.00)
Glucose, Bld: 91 mg/dL (ref 70–99)
Potassium: 3.7 meq/L (ref 3.5–5.1)
Sodium: 139 meq/L (ref 135–145)
Total Bilirubin: 0.4 mg/dL (ref 0.2–1.2)
Total Protein: 6.8 g/dL (ref 6.0–8.3)

## 2023-08-10 LAB — TSH: TSH: 2.64 u[IU]/mL (ref 0.35–5.50)

## 2023-08-11 ENCOUNTER — Encounter: Payer: Self-pay | Admitting: Family Medicine

## 2023-08-11 NOTE — Progress Notes (Signed)
D/w pt.  B12 and iron low.  Will take otc and repeat 1 month

## 2023-08-12 ENCOUNTER — Other Ambulatory Visit (HOSPITAL_BASED_OUTPATIENT_CLINIC_OR_DEPARTMENT_OTHER): Payer: Self-pay

## 2023-08-19 ENCOUNTER — Other Ambulatory Visit (HOSPITAL_BASED_OUTPATIENT_CLINIC_OR_DEPARTMENT_OTHER): Payer: Self-pay

## 2023-08-25 ENCOUNTER — Other Ambulatory Visit: Payer: Self-pay

## 2023-08-25 ENCOUNTER — Other Ambulatory Visit (HOSPITAL_BASED_OUTPATIENT_CLINIC_OR_DEPARTMENT_OTHER): Payer: Self-pay

## 2023-08-26 ENCOUNTER — Encounter: Payer: Self-pay | Admitting: Family Medicine

## 2023-08-26 ENCOUNTER — Ambulatory Visit (INDEPENDENT_AMBULATORY_CARE_PROVIDER_SITE_OTHER): Payer: Medicaid Other | Admitting: Family Medicine

## 2023-08-26 ENCOUNTER — Other Ambulatory Visit (HOSPITAL_BASED_OUTPATIENT_CLINIC_OR_DEPARTMENT_OTHER): Payer: Self-pay

## 2023-08-26 VITALS — BP 119/78 | HR 82 | Temp 97.5°F | Resp 18 | Ht 70.0 in | Wt 328.0 lb

## 2023-08-26 DIAGNOSIS — M25531 Pain in right wrist: Secondary | ICD-10-CM

## 2023-08-26 MED ORDER — DICLOFENAC SODIUM 1 % EX GEL
4.0000 g | Freq: Four times a day (QID) | CUTANEOUS | 3 refills | Status: AC
  Filled 2023-08-26: qty 100, 10d supply, fill #0
  Filled 2023-10-03: qty 100, 30d supply, fill #0

## 2023-08-26 MED ORDER — IBUPROFEN 600 MG PO TABS
600.0000 mg | ORAL_TABLET | Freq: Three times a day (TID) | ORAL | 3 refills | Status: AC | PRN
  Filled 2023-08-26 – 2023-10-03 (×2): qty 30, 10d supply, fill #0
  Filled 2024-03-08 – 2024-05-19 (×2): qty 30, 10d supply, fill #1

## 2023-08-26 NOTE — Patient Instructions (Signed)
Splint, stretches.  Meds sent

## 2023-08-26 NOTE — Progress Notes (Signed)
Subjective:     Patient ID: Taylor Solis, female    DOB: Nov 09, 1975, 48 y.o.   MRN: [id number]  Chief Complaint  Patient presents with   Wrist Pain    Right wrist, right side of pain, sharp pain for 2 weeks, just one spot    HPI Discussed the use of AI scribe software for clinical note transcription with the patient, who gave verbal consent to proceed.  History of Present Illness   Taylor Solis is a 48 year old female who presents with right wrist pain.  She experiences shooting pains in her right wrist, specifically affecting the fourth and fifth fingers, with pain radiating up to the ulnar side of the wrist. The pain intensifies with activities such as typing, writing, and lifting objects, particularly when using the affected fingers.  She has been using a wrist splint and applying ice packs for over a week, but the pain persists. She has not been taking any medication specifically for the wrist pain, although she did take ibuprofen the previous day for back soreness. She has not used any topical treatments for the wrist pain yet.  No numbness or tingling in the affected area, and there is no pain extending to the elbow. .       Health Maintenance Due  Topic Date Due   Cervical Cancer Screening (HPV/Pap Cotest)  Never done    Past Medical History:  Diagnosis Date   Anxiety    Depression    Hypertension    Thyroid disease     Past Surgical History:  Procedure Laterality Date   CESAREAN SECTION     [id number]   CHOLECYSTECTOMY     2017     Current Outpatient Medications:    buPROPion (WELLBUTRIN XL) 150 MG 24 hr tablet, Take 1 tablet (150 mg total) by mouth daily., Disp: 30 tablet, Rfl: 1   busPIRone (BUSPAR) 10 MG tablet, Take 1 tablet (10 mg total) by mouth daily., Disp: 90 tablet, Rfl: 3   clindamycin (CLINDAGEL) 1 % gel, Apply topically 2 (two) times daily., Disp: 60 g, Rfl: 3   diclofenac Sodium (VOLTAREN) 1 % GEL, Apply 4 g topically 4 (four)  times daily., Disp: 100 g, Rfl: 3   escitalopram (LEXAPRO) 20 MG tablet, Take 1 tablet (20 mg total) by mouth daily., Disp: 90 tablet, Rfl: 1   hydrochlorothiazide (HYDRODIURIL) 12.5 MG tablet, Take 1 tablet (12.5 mg total) by mouth daily., Disp: 90 tablet, Rfl: 3   ibuprofen (ADVIL) 600 MG tablet, Take 1 tablet (600 mg total) by mouth every 8 (eight) hours as needed., Disp: 30 tablet, Rfl: 3   levothyroxine (SYNTHROID) 75 MCG tablet, Take 1 tablet (75 mcg total) by mouth daily., Disp: 90 tablet, Rfl: 3   lisinopril (ZESTRIL) 10 MG tablet, Take 1 tablet (10 mg total) by mouth daily., Disp: 90 tablet, Rfl: 3   medroxyPROGESTERone (PROVERA) 10 MG tablet, Take 1 tablet (10 mg total) by mouth daily., Disp: 10 tablet, Rfl: 0   VITAMIN D PO, Take by mouth. 5000 mg once a week, Disp: , Rfl:    Semaglutide-Weight Management (WEGOVY) 0.25 MG/0.5ML SOAJ, Inject 0.25 mg into the skin once a week. (Patient not taking: Reported on 08/26/2023), Disp: 2 mL, Rfl: 0  No Known Allergies ROS neg/noncontributory except as noted HPI/below      Objective:     BP 119/78   Pulse 82   Temp (!) 97.5 F (36.4 C) (Temporal)   Resp 18  Ht 5\' 10"  (1.778 m)   Wt (!) 328 lb (148.8 kg)   SpO2 98%   BMI 47.06 kg/m  Wt Readings from Last 3 Encounters:  08/26/23 (!) 328 lb (148.8 kg)  08/09/23 (!) 322 lb 2 oz (146.1 kg)  07/11/23 (!) 312 lb (141.5 kg)    Physical Exam   Gen: WDWN NAD HEENT: NCAT, conjunctiva not injected, sclera nonicteric MSK:R wrist-some TTP over styeloid process ulna/pisiform  no swelling.  No TTP fingers. +pain w/wrist extension and bending towards radial side.  No pain to move fingers.  Marland Kitchen  NEURO: A&O x3.  CN II-XII intact.  PSYCH: normal mood. Good eye contact     Assessment & Plan:  Right wrist pain  Other orders -     Ibuprofen; Take 1 tablet (600 mg total) by mouth every 8 (eight) hours as needed.  Dispense: 30 tablet; Refill: 3 -     Diclofenac Sodium; Apply 4 g topically 4  (four) times daily.  Dispense: 100 g; Refill: 3  Assessment and Plan    wrist pain R Presents with shooting pain in the right hand, affecting the fourth and fifth fingers, radiating to the wrist on the ulnar side. Pain worsens with typing, writing, and lifting. Physical exam shows tenderness at the pisiform bone and pain with certain wrist movements, likely due to repetitive strain and poor posture while typing. Differential includes ulnar nerve compression or tendinitis. Discussed using Voltaren gel and ibuprofen to reduce inflammation. Prefers medications and self-directed physical therapy exercises before further interventions. Prescribe Voltaren gel for application several times a day and ibuprofen 600 mg. Recommend using a wrist splint and performing wrist physical therapy exercises. Consider referral to sports medicine if symptoms persist.        Return if symptoms worsen or fail to improve.  Angelena Sole, MD

## 2023-09-05 ENCOUNTER — Other Ambulatory Visit (HOSPITAL_BASED_OUTPATIENT_CLINIC_OR_DEPARTMENT_OTHER): Payer: Self-pay

## 2023-09-14 ENCOUNTER — Other Ambulatory Visit (HOSPITAL_BASED_OUTPATIENT_CLINIC_OR_DEPARTMENT_OTHER): Payer: Self-pay

## 2023-09-14 ENCOUNTER — Encounter: Payer: 59 | Admitting: Physician Assistant

## 2023-09-16 ENCOUNTER — Other Ambulatory Visit (HOSPITAL_BASED_OUTPATIENT_CLINIC_OR_DEPARTMENT_OTHER): Payer: Self-pay

## 2023-09-16 ENCOUNTER — Ambulatory Visit (INDEPENDENT_AMBULATORY_CARE_PROVIDER_SITE_OTHER): Payer: Self-pay | Admitting: Radiology

## 2023-09-16 ENCOUNTER — Other Ambulatory Visit (HOSPITAL_COMMUNITY)
Admission: RE | Admit: 2023-09-16 | Discharge: 2023-09-16 | Disposition: A | Discharge status: Home / Self Care | Payer: Self-pay | Source: Ambulatory Visit | Attending: Radiology | Admitting: Radiology

## 2023-09-16 ENCOUNTER — Encounter: Payer: Self-pay | Admitting: Radiology

## 2023-09-16 VITALS — BP 116/82 | Ht 67.25 in | Wt 326.8 lb

## 2023-09-16 DIAGNOSIS — N898 Other specified noninflammatory disorders of vagina: Secondary | ICD-10-CM

## 2023-09-16 DIAGNOSIS — N926 Irregular menstruation, unspecified: Secondary | ICD-10-CM

## 2023-09-16 DIAGNOSIS — Z01419 Encounter for gynecological examination (general) (routine) without abnormal findings: Secondary | ICD-10-CM | POA: Insufficient documentation

## 2023-09-16 MED ORDER — MEDROXYPROGESTERONE ACETATE 10 MG PO TABS
10.0000 mg | ORAL_TABLET | Freq: Every day | ORAL | 0 refills | Status: AC
Start: 2023-09-16 — End: ?
  Filled 2023-09-16 – 2023-10-03 (×2): qty 10, 10d supply, fill #0

## 2023-09-16 NOTE — Patient Instructions (Signed)
 Preventive Care 16-50 Years Old, Female  Preventive care refers to lifestyle choices and visits with your health care provider that can promote health and wellness. Preventive care visits are also called wellness exams.  What can I expect for my preventive care visit?  Counseling  Your health care provider may ask you questions about your:  Medical history, including:  Past medical problems.  Family medical history.  Pregnancy history.  Current health, including:  Menstrual cycle.  Method of birth control.  Emotional well-being.  Home life and relationship well-being.  Sexual activity and sexual health.  Lifestyle, including:  Alcohol, nicotine or tobacco, and drug use.  Access to firearms.  Diet, exercise, and sleep habits.  Work and work Astronomer.  Sunscreen use.  Safety issues such as seatbelt and bike helmet use.  Physical exam  Your health care provider will check your:  Height and weight. These may be used to calculate your BMI (body mass index). BMI is a measurement that tells if you are at a healthy weight.  Waist circumference. This measures the distance around your waistline. This measurement also tells if you are at a healthy weight and may help predict your risk of certain diseases, such as type 2 diabetes and high blood pressure.  Heart rate and blood pressure.  Body temperature.  Skin for abnormal spots.  What immunizations do I need?    Vaccines are usually given at various ages, according to a schedule. Your health care provider will recommend vaccines for you based on your age, medical history, and lifestyle or other factors, such as travel or where you work.  What tests do I need?  Screening  Your health care provider may recommend screening tests for certain conditions. This may include:  Lipid and cholesterol levels.  Diabetes screening. This is done by checking your blood sugar (glucose) after you have not eaten for a while (fasting).  Pelvic exam and Pap test.  Hepatitis B test.  Hepatitis C  test.  HIV (human immunodeficiency virus) test.  STI (sexually transmitted infection) testing, if you are at risk.  Lung cancer screening.  Colorectal cancer screening.  Mammogram. Talk with your health care provider about when you should start having regular mammograms. This may depend on whether you have a family history of breast cancer.  BRCA-related cancer screening. This may be done if you have a family history of breast, ovarian, tubal, or peritoneal cancers.  Bone density scan. This is done to screen for osteoporosis.  Talk with your health care provider about your test results, treatment options, and if necessary, the need for more tests.  Follow these instructions at home:  Eating and drinking    Eat a diet that includes fresh fruits and vegetables, whole grains, lean protein, and low-fat dairy products.  Take vitamin and mineral supplements as recommended by your health care provider.  Do not drink alcohol if:  Your health care provider tells you not to drink.  You are pregnant, may be pregnant, or are planning to become pregnant.  If you drink alcohol:  Limit how much you have to 0-1 drink a day.  Know how much alcohol is in your drink. In the [place] one drink equals one 12 oz bottle of beer (355 mL), one 5 oz glass of wine (148 mL), or one 1 oz glass of hard liquor (44 mL).  Lifestyle  Brush your teeth every morning and night with fluoride toothpaste. Floss one time each day.  Exercise for at least  30 minutes 5 or more days each week.  Do not use any products that contain nicotine or tobacco. These products include cigarettes, chewing tobacco, and vaping devices, such as e-cigarettes. If you need help quitting, ask your health care provider.  Do not use drugs.  If you are sexually active, practice safe sex. Use a condom or other form of protection to prevent STIs.  If you do not wish to become pregnant, use a form of birth control. If you plan to become pregnant, see your health care provider for a  prepregnancy visit.  Take aspirin only as told by your health care provider. Make sure that you understand how much to take and what form to take. Work with your health care provider to find out whether it is safe and beneficial for you to take aspirin daily.  Find healthy ways to manage stress, such as:  Meditation, yoga, or listening to music.  Journaling.  Talking to a trusted person.  Spending time with friends and family.  Minimize exposure to UV radiation to reduce your risk of skin cancer.  Safety  Always wear your seat belt while driving or riding in a vehicle.  Do not drive:  If you have been drinking alcohol. Do not ride with someone who has been drinking.  When you are tired or distracted.  While texting.  If you have been using any mind-altering substances or drugs.  Wear a helmet and other protective equipment during sports activities.  If you have firearms in your house, make sure you follow all gun safety procedures.  Seek help if you have been physically or sexually abused.  What's next?  Visit your health care provider once a year for an annual wellness visit.  Ask your health care provider how often you should have your eyes and teeth checked.  Stay up to date on all vaccines.  This information is not intended to replace advice given to you by your health care provider. Make sure you discuss any questions you have with your health care provider.  Document Revised: 12/24/2020 Document Reviewed: 12/24/2020  Elsevier Patient Education  2024 [employer].

## 2023-09-16 NOTE — Progress Notes (Signed)
 Taylor Solis 1976/05/27 [id number]   History:  48 y.o. G3P2 presents for annual exam. C/o lump at introitus notcied 2 months ago. LMP 06/2023 took provera 1 month ago and did not have a cycle.  Gynecologic History Patient's last menstrual period was 06/21/2023 (exact date). Period Cycle (Days):  (skipping periods since 2011) Period Pattern: (!) Irregular Menstrual Flow: Light, Moderate, Heavy Menstrual Control: Thin pad, Maxi pad Dysmenorrhea: (!) Mild Dysmenorrhea Symptoms: Cramping Contraception/Family planning: none Sexually active: yes Last Pap: 2021. Results were: normal Last mammogram: 6/24. Results were: normal  Obstetric History OB History  Gravida Para Term Preterm AB Living  3 2    2   SAB IAB Ectopic Multiple Live Births          # Outcome Date GA Lbr Len/2nd Weight Sex Type Anes PTL Lv  3 Gravida           2 Para           1 Para                09/16/2023    2:57 PM 07/11/2023   10:58 AM 06/03/2023   10:31 AM  Depression screen PHQ 2/9  Decreased Interest 1 0 0  Down, Depressed, Hopeless 1 0 0  PHQ - 2 Score 2 0 0  Altered sleeping 2 0 1  Tired, decreased energy 3 0 2  Change in appetite 2 0 1  Feeling bad or failure about yourself  0 0 0  Trouble concentrating 1 0 0  Moving slowly or fidgety/restless 0 0 0  Suicidal thoughts 0 0 0  PHQ-9 Score 10 0 4  Difficult doing work/chores Somewhat difficult Not difficult at all Not difficult at all     The following portions of the patient's history were reviewed and updated as appropriate: allergies, current medications, past family history, past medical history, past social history, past surgical history, and problem list.  Review of Systems  All other systems reviewed and are negative.   Past medical history, past surgical history, family history and social history were all reviewed and documented in the EPIC chart.  Exam:  Vitals:   09/16/23 1452  BP: 116/82  Weight: (!) 326 lb 12.8 oz  (148.2 kg)  Height: 5' 7.25" (1.708 m)   Body mass index is 50.8 kg/m.  Physical Exam Vitals and nursing note reviewed. Exam conducted with a chaperone present.  Constitutional:      Appearance: Normal appearance. She is normal weight.  HENT:     Head: Normocephalic and atraumatic.  Neck:     Thyroid: No thyroid mass, thyromegaly or thyroid tenderness.  Cardiovascular:     Rate and Rhythm: Regular rhythm.     Heart sounds: Normal heart sounds.  Pulmonary:     Effort: Pulmonary effort is normal.     Breath sounds: Normal breath sounds.  Chest:  Breasts:    Breasts are symmetrical.     Right: Normal. No inverted nipple, mass, nipple discharge, skin change or tenderness.     Left: Normal. No inverted nipple, mass, nipple discharge, skin change or tenderness.  Abdominal:     General: Abdomen is flat. Bowel sounds are normal.     Palpations: Abdomen is soft.  Genitourinary:    General: Normal vulva.     Vagina: Normal. No vaginal discharge, bleeding or lesions (2cm cyst present at introitus right vaginal wall).     Cervix: Normal. No discharge or lesion.  Uterus: Normal. Not enlarged and not tender.      Adnexa: Right adnexa normal and left adnexa normal.       Right: No mass, tenderness or fullness.         Left: No mass, tenderness or fullness.    Lymphadenopathy:     Upper Body:     Right upper body: No axillary adenopathy.     Left upper body: No axillary adenopathy.  Skin:    General: Skin is warm and dry.  Neurological:     Mental Status: She is alert and oriented to person, place, and time.  Psychiatric:        Mood and Affect: Mood normal.        Thought Content: Thought content normal.        Judgment: Judgment normal.      Raynelle Fanning, CMA present for exam  Assessment/Plan:   1. Well woman exam with routine gynecological exam (Primary) - Cytology - PAP( [facility])  2. Irregular menses - medroxyPROGESTERone (PROVERA) 10 MG tablet; Take 1 tablet (10 mg  total) by mouth daily.  Dispense: 10 tablet; Refill: 0 - US Transvaginal Non-OB; Future  3. Vaginal cyst Reassured will likely reabsorb or pop on its own    Return in about 1 year (around 09/15/2024) for Annual.  Arlie Solomons B WHNP-BC 3:04 PM 09/16/2023

## 2023-09-21 ENCOUNTER — Other Ambulatory Visit: Payer: Self-pay | Admitting: Radiology

## 2023-09-21 ENCOUNTER — Other Ambulatory Visit (HOSPITAL_BASED_OUTPATIENT_CLINIC_OR_DEPARTMENT_OTHER): Payer: Self-pay

## 2023-09-21 DIAGNOSIS — B3731 Acute candidiasis of vulva and vagina: Secondary | ICD-10-CM

## 2023-09-21 LAB — CYTOLOGY - PAP
Comment: NEGATIVE
Diagnosis: NEGATIVE
High risk HPV: NEGATIVE

## 2023-09-21 MED ORDER — FLUCONAZOLE 150 MG PO TABS
150.0000 mg | ORAL_TABLET | ORAL | 0 refills | Status: AC
  Filled 2023-09-21 – 2023-10-03 (×2): qty 2, 6d supply, fill #0

## 2023-09-26 ENCOUNTER — Other Ambulatory Visit (HOSPITAL_BASED_OUTPATIENT_CLINIC_OR_DEPARTMENT_OTHER): Payer: Self-pay

## 2023-10-03 ENCOUNTER — Other Ambulatory Visit (HOSPITAL_BASED_OUTPATIENT_CLINIC_OR_DEPARTMENT_OTHER): Payer: Self-pay

## 2023-10-03 ENCOUNTER — Other Ambulatory Visit: Payer: Self-pay

## 2023-10-05 ENCOUNTER — Encounter: Payer: Self-pay | Admitting: Radiology

## 2023-10-19 ENCOUNTER — Telehealth: Payer: Self-pay | Admitting: Radiology

## 2023-10-19 NOTE — Telephone Encounter (Signed)
 Jami ordered an ultrasound for patient to schedule. Two messages have been left and letter mailed asking patient to call and schedule ultrasound appointment; patient has not called back.

## 2023-11-02 NOTE — Telephone Encounter (Signed)
 Spoke with patient. PUS ordered 09/16/23 for irregular menses. Patient declines to schedule at this time. States no changes in menses. Patient states she is waiting for new insurance. Patient states when new plan is effective she will return call to schedule. Patient aware to call if she has any additional questions or needs any assistance in the meantime.  Routing to provider for final review. Patient is agreeable to disposition. Will close encounter.

## 2023-11-11 ENCOUNTER — Ambulatory Visit: Admitting: Family Medicine

## 2023-11-14 ENCOUNTER — Ambulatory Visit: Admitting: Family Medicine

## 2024-03-08 ENCOUNTER — Other Ambulatory Visit: Payer: Self-pay

## 2024-03-08 ENCOUNTER — Other Ambulatory Visit: Payer: Self-pay | Admitting: Family Medicine

## 2024-03-08 ENCOUNTER — Other Ambulatory Visit (HOSPITAL_BASED_OUTPATIENT_CLINIC_OR_DEPARTMENT_OTHER): Payer: Self-pay

## 2024-03-08 MED ORDER — LEVOTHYROXINE SODIUM 75 MCG PO TABS
75.0000 ug | ORAL_TABLET | Freq: Every day | ORAL | 3 refills | Status: AC
  Filled 2024-03-08: qty 90, 90d supply, fill #0

## 2024-03-08 MED ORDER — BUSPIRONE HCL 10 MG PO TABS
10.0000 mg | ORAL_TABLET | Freq: Every day | ORAL | 3 refills | Status: AC
  Filled 2024-03-08: qty 90, 90d supply, fill #0

## 2024-03-08 MED ORDER — HYDROCHLOROTHIAZIDE 12.5 MG PO TABS
12.5000 mg | ORAL_TABLET | Freq: Every day | ORAL | 3 refills | Status: AC
  Filled 2024-03-08 – 2024-03-22 (×2): qty 90, 90d supply, fill #0

## 2024-03-08 MED ORDER — LISINOPRIL 10 MG PO TABS
10.0000 mg | ORAL_TABLET | Freq: Every day | ORAL | 3 refills | Status: AC
  Filled 2024-03-08 – 2024-03-22 (×2): qty 90, 90d supply, fill #0

## 2024-03-19 ENCOUNTER — Other Ambulatory Visit (HOSPITAL_BASED_OUTPATIENT_CLINIC_OR_DEPARTMENT_OTHER): Payer: Self-pay

## 2024-03-23 ENCOUNTER — Other Ambulatory Visit (HOSPITAL_BASED_OUTPATIENT_CLINIC_OR_DEPARTMENT_OTHER): Payer: Self-pay

## 2024-05-20 ENCOUNTER — Other Ambulatory Visit (HOSPITAL_BASED_OUTPATIENT_CLINIC_OR_DEPARTMENT_OTHER): Payer: Self-pay

## 2024-05-31 ENCOUNTER — Other Ambulatory Visit (HOSPITAL_BASED_OUTPATIENT_CLINIC_OR_DEPARTMENT_OTHER): Payer: Self-pay

## 2024-07-19 NOTE — Telephone Encounter (Signed)
 Jami -please advise if ok to proceed with PUS and consult.   Patient will also be due for AEX 09/2024.

## 2024-07-19 NOTE — Telephone Encounter (Signed)
 Last AEX 09/16/23 -JC No future AEX scheduled PUS ordered for irregular menses

## 2024-07-19 NOTE — Telephone Encounter (Signed)
 Ok to schedule u/s then consult

## 2024-07-19 NOTE — Telephone Encounter (Signed)
 Call placed to patient, left detailed message, ok per dpr. Advised per Jami. Return call to office at [phone], opt 1 for appointments to schedule PUS and consult with Jami. Will be due AEX 09/2024.

## 2024-07-24 NOTE — Telephone Encounter (Signed)
 Per review of EPIC, patient is scheduled for PUS and consult with JC on 07/31/24.   Encounter closed.

## 2024-07-31 ENCOUNTER — Other Ambulatory Visit (HOSPITAL_BASED_OUTPATIENT_CLINIC_OR_DEPARTMENT_OTHER): Payer: Self-pay

## 2024-07-31 ENCOUNTER — Ambulatory Visit (INDEPENDENT_AMBULATORY_CARE_PROVIDER_SITE_OTHER): Payer: Self-pay | Admitting: Radiology

## 2024-07-31 ENCOUNTER — Other Ambulatory Visit

## 2024-07-31 VITALS — BP 126/88 | HR 69 | Wt 328.0 lb

## 2024-07-31 DIAGNOSIS — N926 Irregular menstruation, unspecified: Secondary | ICD-10-CM

## 2024-07-31 DIAGNOSIS — D219 Benign neoplasm of connective and other soft tissue, unspecified: Secondary | ICD-10-CM | POA: Diagnosis not present

## 2024-07-31 MED ORDER — MISOPROSTOL 200 MCG PO TABS
400.0000 ug | ORAL_TABLET | Freq: Once | ORAL | 0 refills | Status: AC
  Filled 2024-07-31: qty 2, 1d supply, fill #0

## 2024-07-31 NOTE — Progress Notes (Signed)
" ° °  Taylor Solis 02/03/1976 [id number]   History:  49 y.o. G3P2 presents for ultrasound. Tried provera  without withdrawal bleed x 2. 1 sporadic cycle 02/10/24  Gynecologic History Patient's last menstrual period was 02/10/2024 (approximate).   Contraception/Family planning: none Sexually active: yes Last Pap: 2025. Results were: normal Last mammogram: 6/24. Results were: normal  Obstetric History OB History  Gravida Para Term Preterm AB Living  3 2 2  0 1 2  SAB IAB Ectopic Multiple Live Births  1 0 0 0 2    # Outcome Date GA Lbr Len/2nd Weight Sex Type Anes PTL Lv  3 SAB           2 Term           1 Term               The following portions of the patient's history were reviewed and updated as appropriate: allergies, current medications, past family history, past medical history, past social history, past surgical history, and problem list.  Review of Systems  All other systems reviewed and are negative.   Past medical history, past surgical history, family history and social history were all reviewed and documented in the EPIC chart.  Exam:  Vitals:   07/31/24 1439  BP: 126/88  Pulse: 69  SpO2: 98%  Weight: (!) 328 lb (148.8 kg)   Body mass index is 50.99 kg/m.  Narrative & Impression Indication:amenorrhea   Vaginal ultrasound   Anteverted uterus, normal size and shape 8.15 x 5.72 x 5.04 cm Intramural Fibroids largest 17mm   Thin symmetrical endometrium 4.88 mm No masses or thickening seen, avascular   Both ovaries normal size with normal follicle pattern and perfusion   No adnexal masses No free fluid Exam is limited by BMI   Impression:small fibroids, otherwise unremarkable       Exam Ended: 07/31/24 14:34 Last Resulted: 07/31/24 15:00    Assessment/Plan:   1. Irregular menses (Primary) - FSH - Estradiol, Ultra Sens - Endometrial biopsy; Future - misoprostol  (CYTOTEC ) 200 MCG tablet; Place 2 tablets (400 mcg total) vaginally once the  night before procedure.  Dispense: 2 tablet; Refill: 0    Return for EMB .  GINETTE COZIER B WHNP-BC 3:01 PM 07/31/2024 "

## 2024-08-01 LAB — FOLLICLE STIMULATING HORMONE: FSH: 27.8 m[IU]/mL

## 2024-08-06 LAB — ESTRADIOL, ULTRA SENS: Estradiol, Ultra Sensitive: 21 pg/mL

## 2024-08-10 ENCOUNTER — Other Ambulatory Visit (HOSPITAL_BASED_OUTPATIENT_CLINIC_OR_DEPARTMENT_OTHER): Payer: Self-pay

## 2024-08-14 ENCOUNTER — Ambulatory Visit: Admitting: Radiology

## 2024-08-21 ENCOUNTER — Encounter: Payer: Self-pay | Admitting: Family Medicine

## 2024-09-18 ENCOUNTER — Ambulatory Visit: Admitting: Radiology
